# Patient Record
Sex: Female | Born: 1960 | Race: White | Hispanic: No | Marital: Married | Smoking: Former smoker
Health system: Southern US, Community
[De-identification: ages and names within clinical notes are randomized; demographics above are authoritative.]

## PROBLEM LIST (undated history)

## (undated) DIAGNOSIS — M797 Fibromyalgia: Secondary | ICD-10-CM

## (undated) DIAGNOSIS — K279 Peptic ulcer, site unspecified, unspecified as acute or chronic, without hemorrhage or perforation: Secondary | ICD-10-CM

## (undated) DIAGNOSIS — R609 Edema, unspecified: Secondary | ICD-10-CM

## (undated) DIAGNOSIS — F411 Generalized anxiety disorder: Secondary | ICD-10-CM

## (undated) DIAGNOSIS — F329 Major depressive disorder, single episode, unspecified: Secondary | ICD-10-CM

## (undated) DIAGNOSIS — I10 Essential (primary) hypertension: Secondary | ICD-10-CM

## (undated) DIAGNOSIS — E785 Hyperlipidemia, unspecified: Secondary | ICD-10-CM

## (undated) DIAGNOSIS — K7689 Other specified diseases of liver: Secondary | ICD-10-CM

## (undated) DIAGNOSIS — E039 Hypothyroidism, unspecified: Secondary | ICD-10-CM

## (undated) DIAGNOSIS — K760 Fatty (change of) liver, not elsewhere classified: Secondary | ICD-10-CM

## (undated) DIAGNOSIS — F32A Depression, unspecified: Secondary | ICD-10-CM

## (undated) HISTORY — DX: Major depressive disorder, single episode, unspecified: F32.9

## (undated) HISTORY — DX: Essential (primary) hypertension: I10

## (undated) HISTORY — DX: Other specified diseases of liver: K76.89

## (undated) HISTORY — DX: Hypothyroidism, unspecified: E03.9

## (undated) HISTORY — DX: Depression, unspecified: F32.A

## (undated) HISTORY — DX: Generalized anxiety disorder: F41.1

## (undated) HISTORY — PX: TUBAL LIGATION: SHX77

## (undated) HISTORY — DX: Edema, unspecified: R60.9

## (undated) HISTORY — DX: Fibromyalgia: M79.7

## (undated) HISTORY — DX: Peptic ulcer, site unspecified, unspecified as acute or chronic, without hemorrhage or perforation: K27.9

## (undated) HISTORY — DX: Hyperlipidemia, unspecified: E78.5

## (undated) HISTORY — DX: Fatty (change of) liver, not elsewhere classified: K76.0

---

## 1988-10-26 DIAGNOSIS — M797 Fibromyalgia: Secondary | ICD-10-CM

## 1988-10-26 HISTORY — DX: Fibromyalgia: M79.7

## 2001-10-26 HISTORY — PX: VAGINAL HYSTERECTOMY: SUR661

## 2007-08-31 ENCOUNTER — Encounter: Admission: RE | Admit: 2007-08-31 | Discharge: 2007-08-31 | Payer: Self-pay | Admitting: *Deleted

## 2007-10-27 HISTORY — PX: CHOLECYSTECTOMY: SHX55

## 2008-01-24 ENCOUNTER — Emergency Department (HOSPITAL_COMMUNITY): Admission: EM | Admit: 2008-01-24 | Discharge: 2008-01-24 | Payer: Self-pay | Admitting: Emergency Medicine

## 2008-02-04 ENCOUNTER — Emergency Department (HOSPITAL_COMMUNITY): Admission: EM | Admit: 2008-02-04 | Discharge: 2008-02-04 | Payer: Self-pay | Admitting: Emergency Medicine

## 2008-04-30 ENCOUNTER — Ambulatory Visit: Payer: Self-pay | Admitting: Internal Medicine

## 2008-04-30 DIAGNOSIS — E039 Hypothyroidism, unspecified: Secondary | ICD-10-CM | POA: Insufficient documentation

## 2008-04-30 DIAGNOSIS — R609 Edema, unspecified: Secondary | ICD-10-CM | POA: Insufficient documentation

## 2008-04-30 DIAGNOSIS — I10 Essential (primary) hypertension: Secondary | ICD-10-CM | POA: Insufficient documentation

## 2008-04-30 DIAGNOSIS — E785 Hyperlipidemia, unspecified: Secondary | ICD-10-CM

## 2008-04-30 LAB — CONVERTED CEMR LAB: Blood Glucose, Fingerstick: 107

## 2008-05-29 ENCOUNTER — Telehealth: Payer: Self-pay | Admitting: Internal Medicine

## 2008-08-01 ENCOUNTER — Telehealth: Payer: Self-pay | Admitting: Internal Medicine

## 2008-12-20 ENCOUNTER — Ambulatory Visit: Payer: Self-pay | Admitting: Internal Medicine

## 2009-03-23 IMAGING — CR DG CHEST 2V
2 series · 2 of 2 positions shown · non-contrast
Comparison: None

CLINICAL DATA: Short of breath and epigastric abdominal pain

CHEST - 2 VIEW

[w chest pa]
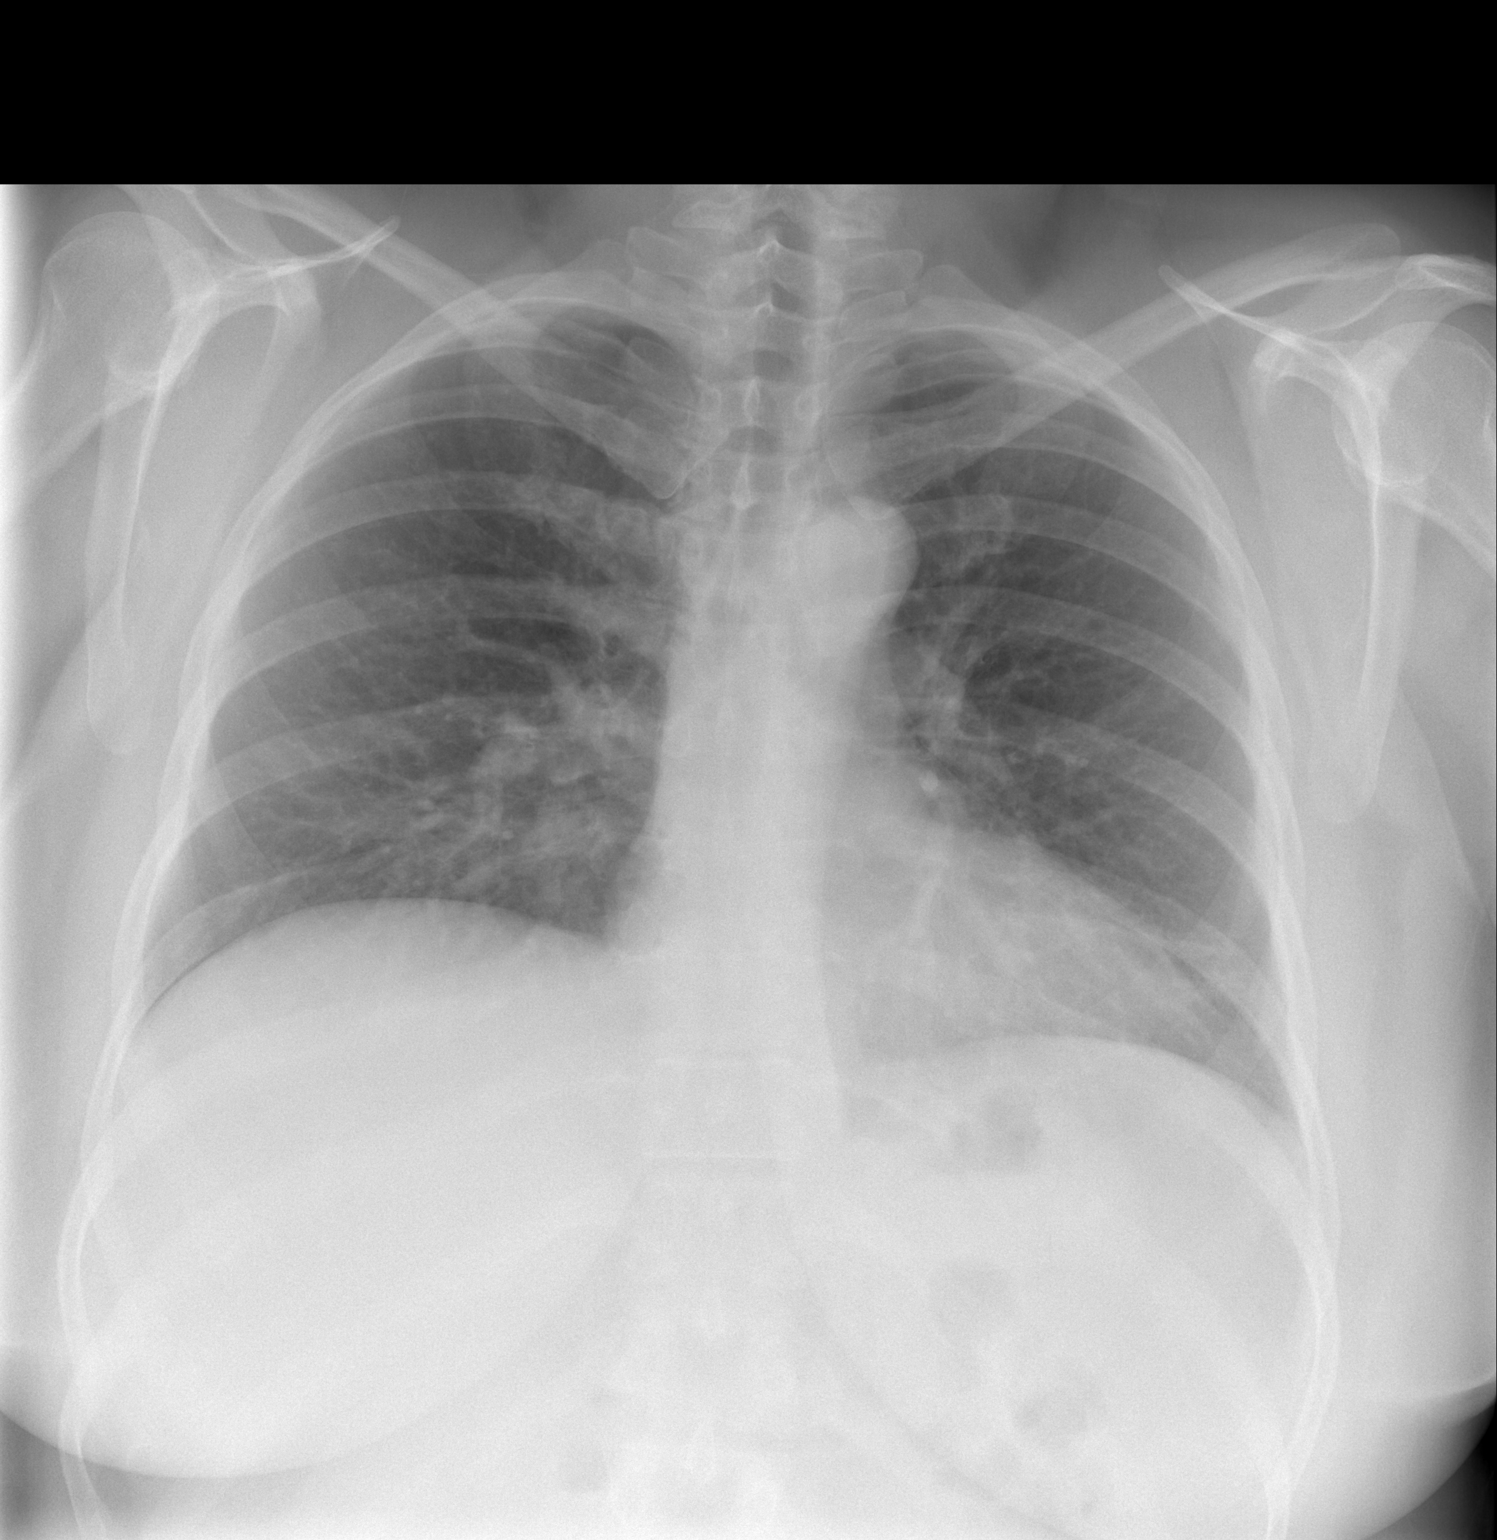

[w chest lat]
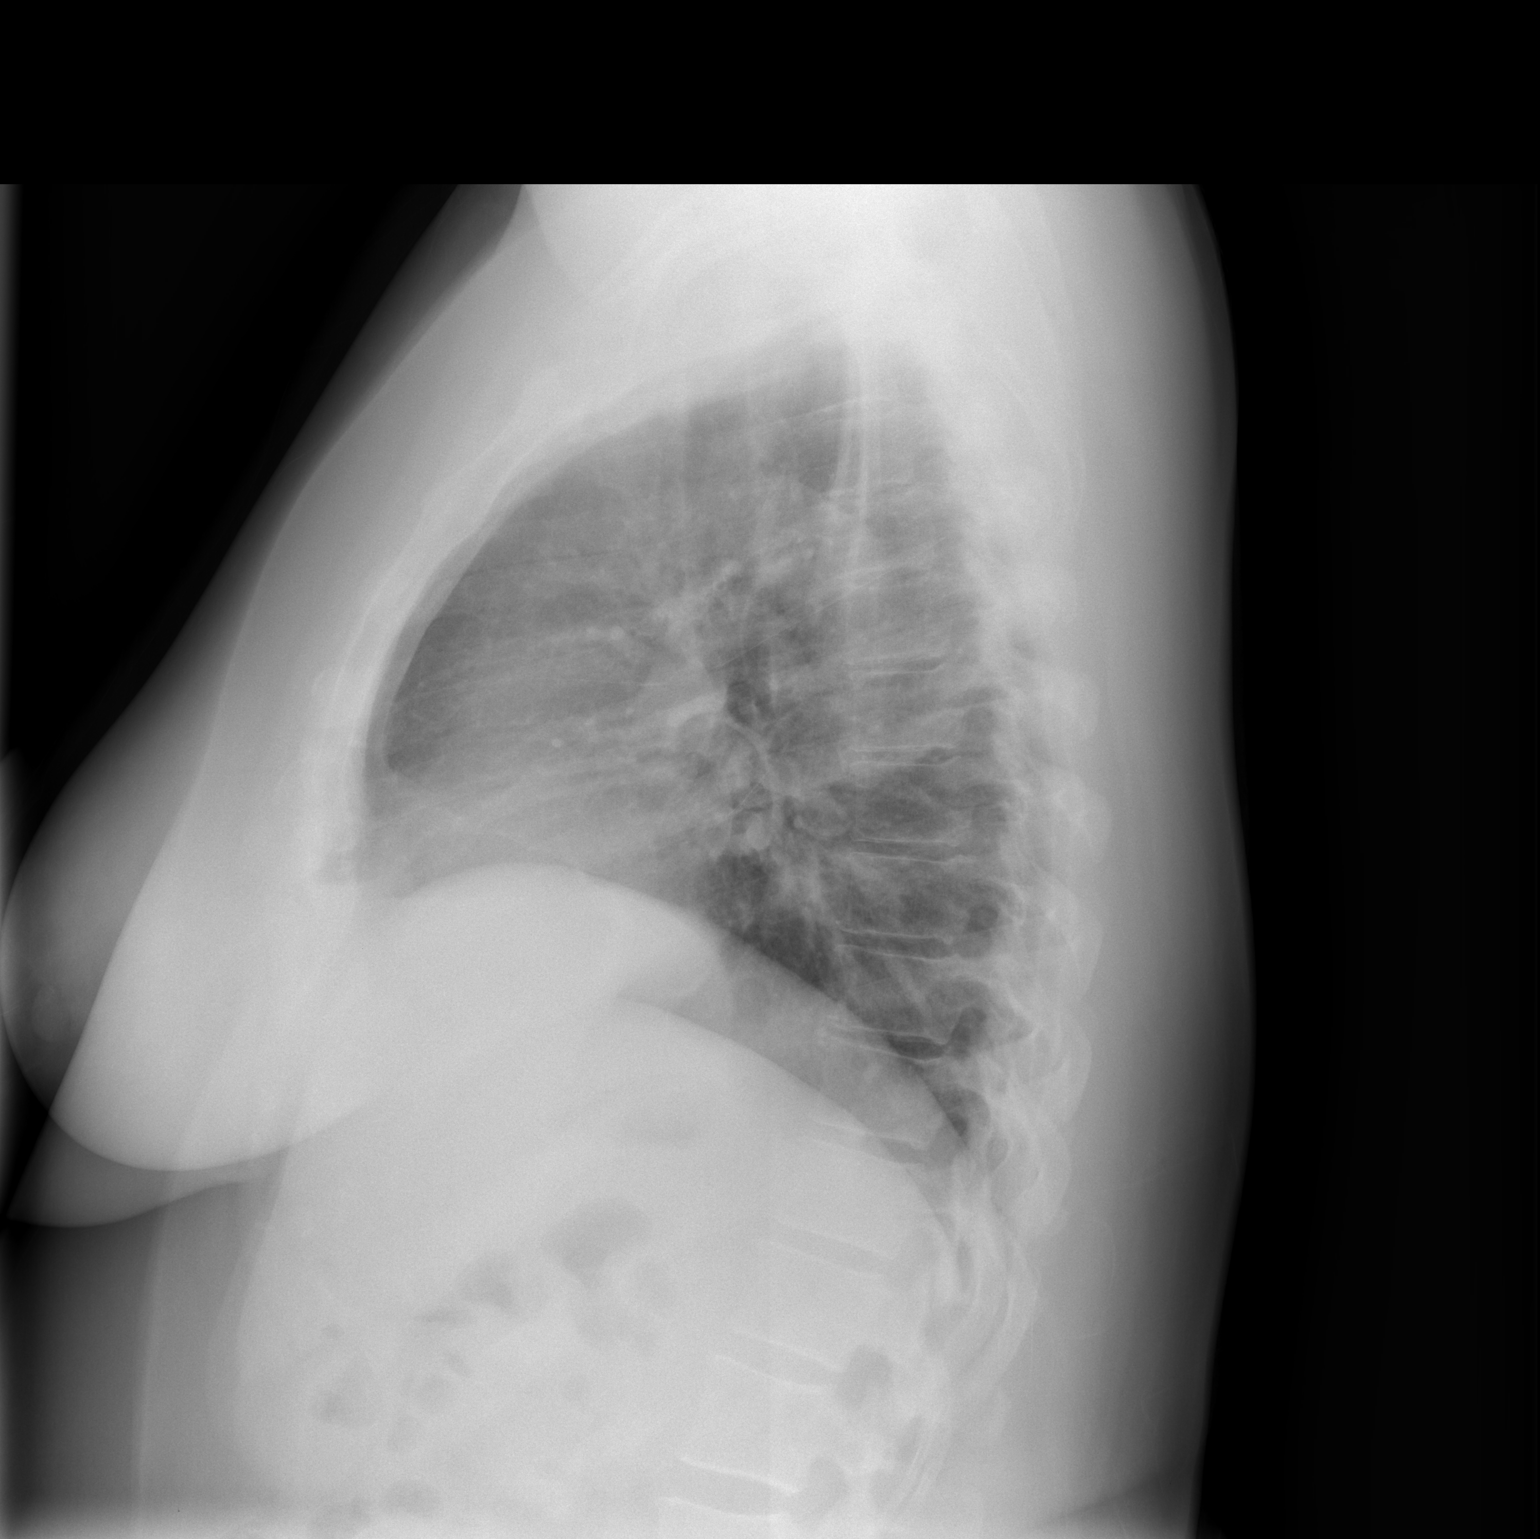

[2 of 2 positions shown; findings below may reference images not displayed]

FINDINGS: Low bilateral lung volumes are present with bibasilar
atelectasis.  No edema or infiltrate.  The heart size is normal.
No pleural effusions.  Bony thorax is unremarkable.
IMPRESSION: No active disease.  Low volumes with bibasilar atelectasis.

## 2009-03-23 IMAGING — US US ABDOMEN COMPLETE
1 series · 13 of 25 positions shown · non-contrast
Comparison: None.

CLINICAL DATA: Right upper quadrant abdominal pain.

ABDOMEN ULTRASOUND
TECHNIQUE: Complete abdominal ultrasound examination was performed
including evaluation of the liver, gallbladder, bile ducts,
pancreas, kidneys, spleen, IVC, and abdominal aorta.

[Series 1: unknown · 0.37mm/px · 13 of 85 slices shown]
[im 1/85]
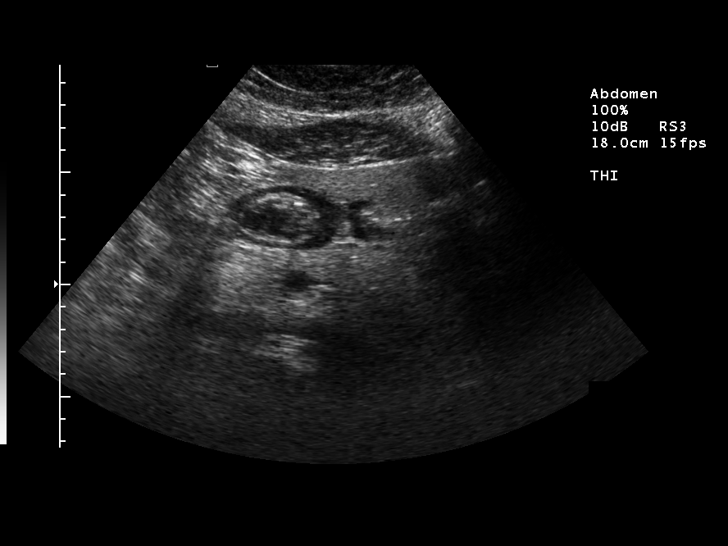
[im 8/85]
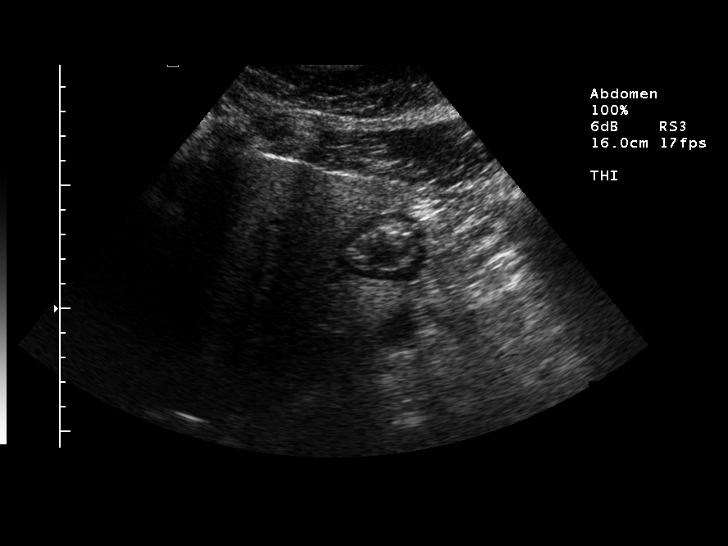
[im 15/85]
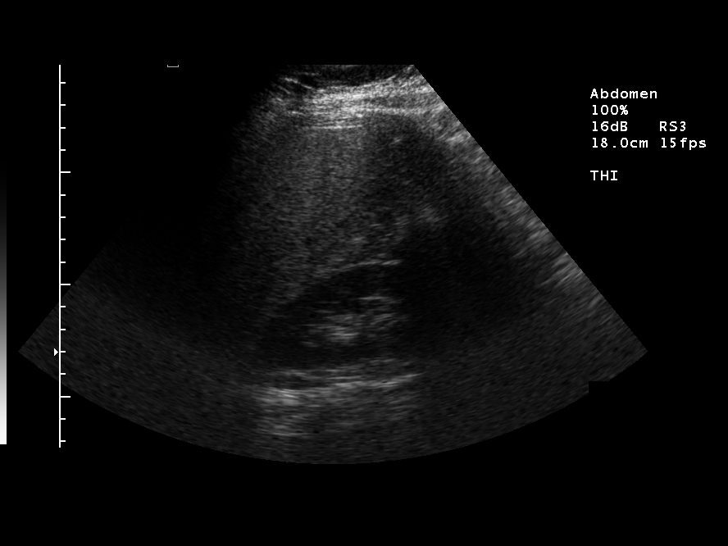
[im 22/85]
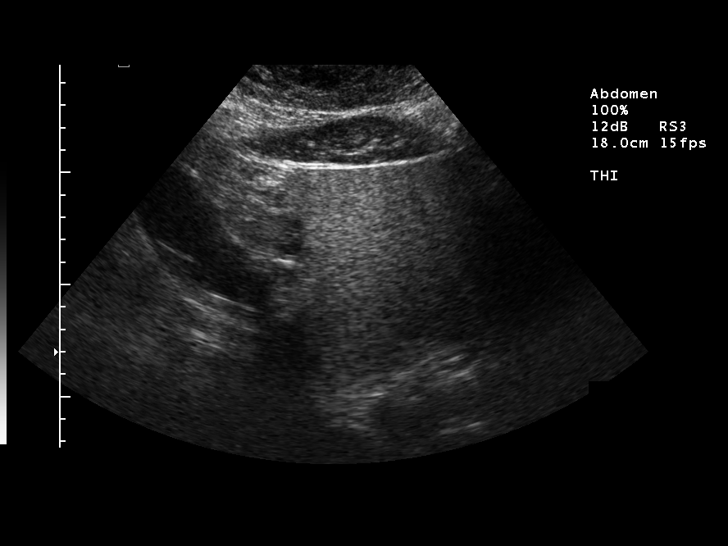
[im 29/85]
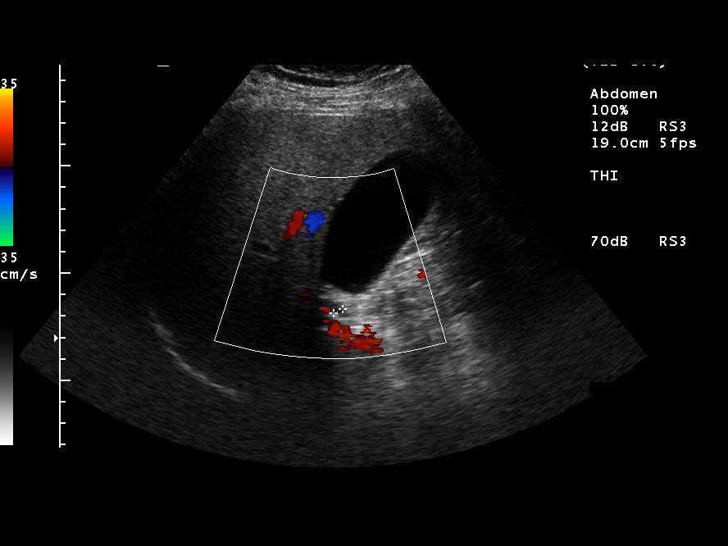
[im 36/85]
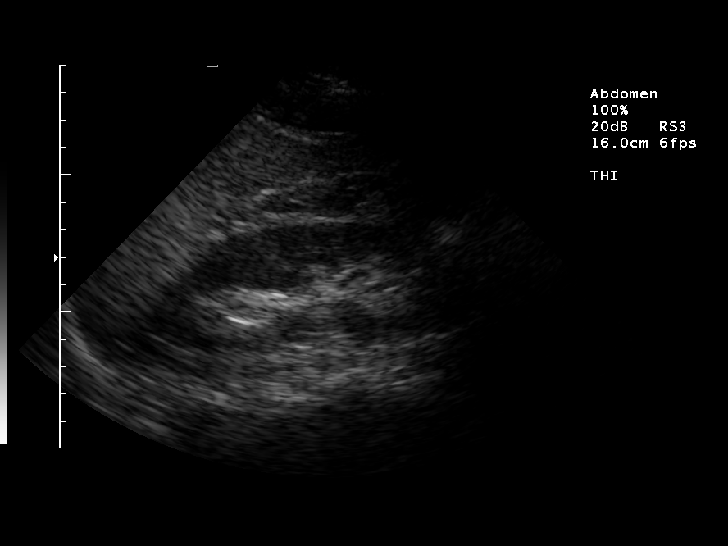
[im 43/85]
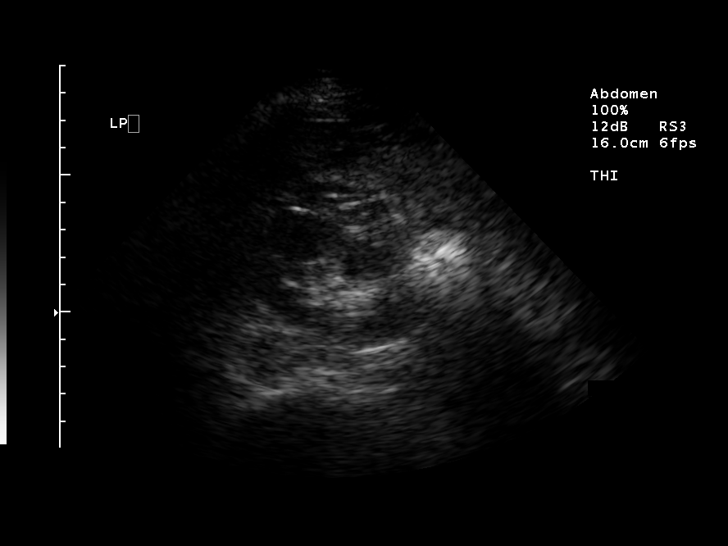
[im 50/85]
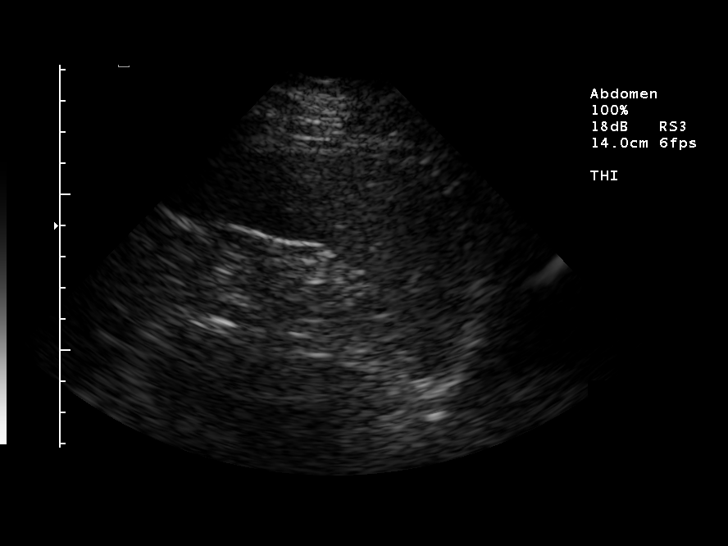
[im 57/85]
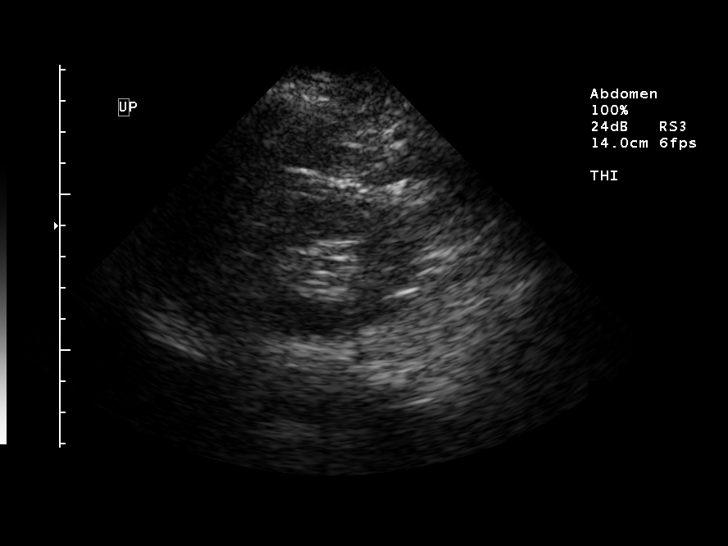
[im 64/85]
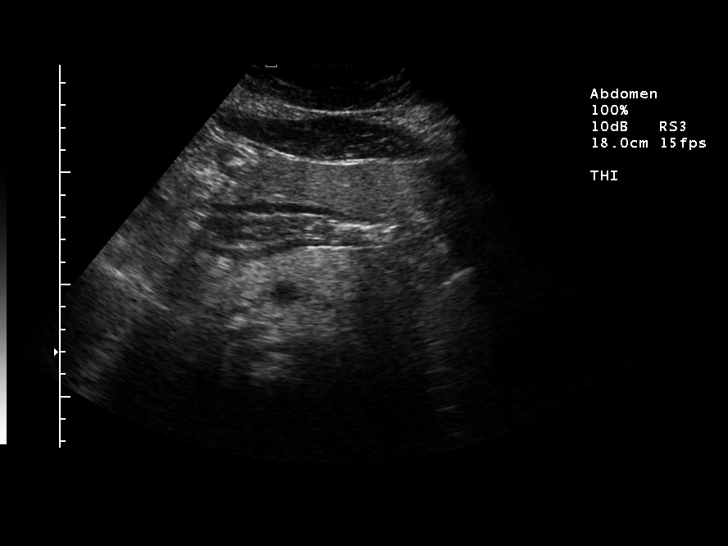
[im 71/85]
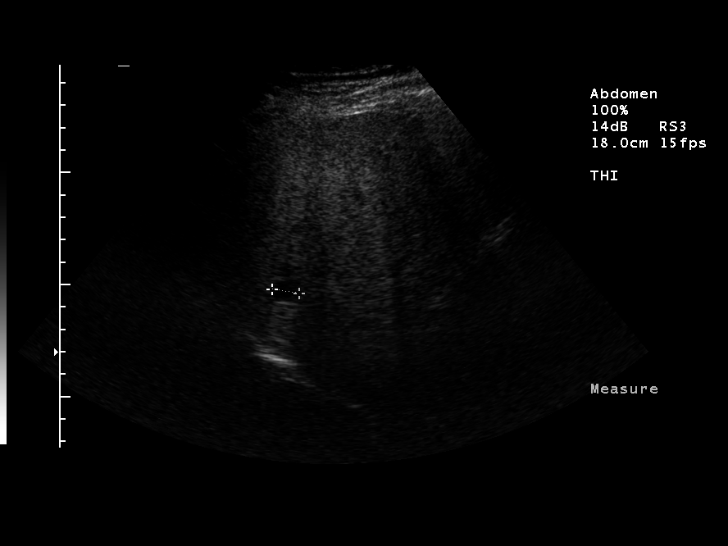
[im 78/85]
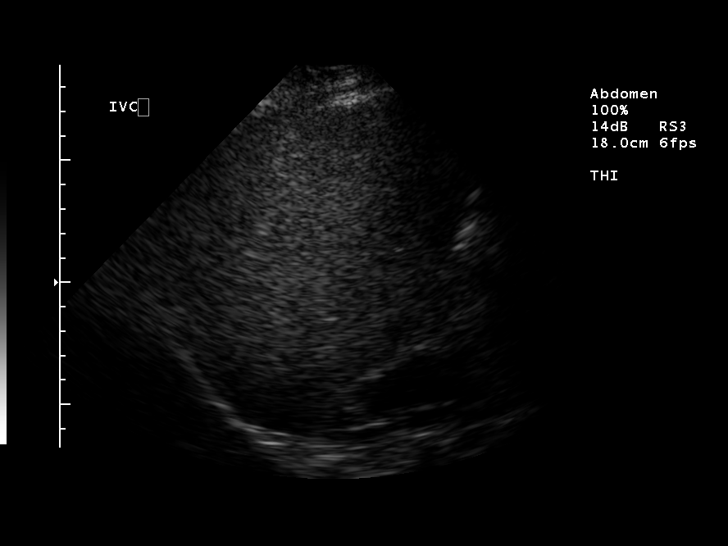
[im 85/85]
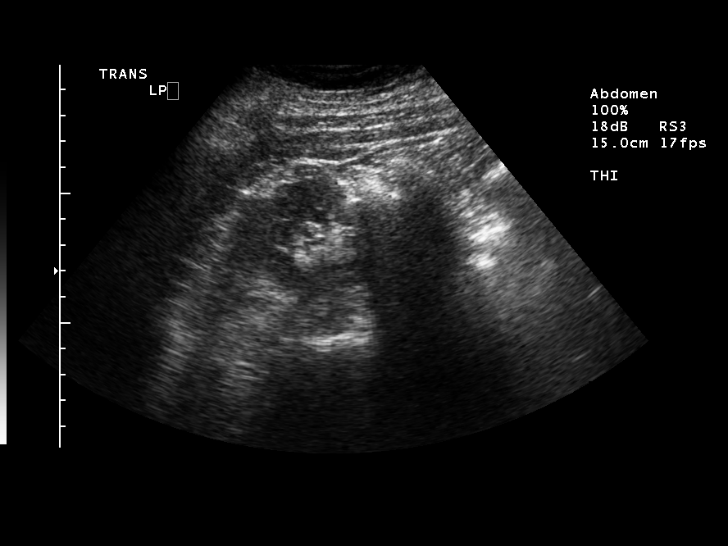

[13 of 25 positions shown; findings below may reference images not displayed]

FINDINGS: Multiple gallstones are seen within the neck of the
gallbladder.  The gallbladder wall is of normal thickness,
measuring 2.4 mm maximally.  There is no sonographic Murphy's sign.

Mild fatty infiltration of liver is evident.  Multiple benign-
appearing cysts are present within the right lobe.  The largest
measures 1.7 x 1.3 x 1.5 cm.  Common bile duct is within normal
limits at 4.6 mm.

The IVC is patent.  The visualized portions of pancreas, including
the head and proximal body are within normal limits.  There is some
limitation due to overlying bowel gas.  The spleen is of normal
size and echotexture, measuring 8.1 cm maximally.  The aorta
measures 1.8 cm maximally.  It is not well seen due to overlying
bowel gas.

The kidneys are of normal size and echotexture bilaterally.  No
focal stones or mass lesions are seen.  There is no hydronephrosis.
The right kidney measures 14.2 cm maximally.  The left kidney
measures 13.0 cm maximally.
IMPRESSION: 1.  Cholelithiasis without sonographic evidence for cholecystitis.
2.  Benign-appearing liver cysts.
3.  Probable fatty infiltration of the liver.

## 2010-01-16 ENCOUNTER — Ambulatory Visit: Payer: Self-pay | Admitting: Internal Medicine

## 2010-01-16 DIAGNOSIS — F411 Generalized anxiety disorder: Secondary | ICD-10-CM | POA: Insufficient documentation

## 2010-05-19 ENCOUNTER — Telehealth: Payer: Self-pay | Admitting: Internal Medicine

## 2010-11-16 ENCOUNTER — Encounter: Payer: Self-pay | Admitting: Internal Medicine

## 2010-11-16 ENCOUNTER — Encounter: Payer: Self-pay | Admitting: Emergency Medicine

## 2010-11-25 NOTE — Assessment & Plan Note (Signed)
Summary: renew meds//ccm/pt rsc/cjr   Vital Signs:  Patient profile:   50 year old female Weight:      210 pounds Temp:     98.5 degrees F oral BP sitting:   122 / 70  (left arm) Cuff size:   regular  Vitals Entered By: Duard Brady LPN (January 16, 2010 2:03 PM) CC: medication review with refills for all , requestion wellbutrin - mother just dx with termianl Ca Is Patient Diabetic? No   CC:  medication review with refills for all  and requestion wellbutrin - mother just dx with termianl Ca.  History of Present Illness: 50 year old patient who is seen today for medical follow-up.  She has not been seen in over one year.  Her mother has resummoned diagnosed with terminal cancer and she is doing quite anxious and distraught.  She has been tearful and has been having sleeping difficulties.  She has treated hypertension, which has been stable.  She has hypothyroidism and also a history of allergic rhinitis.  There is a history of mild dyslipidemia.  Preventive Screening-Counseling & Management  Alcohol-Tobacco     Smoking Status: never  Allergies: 1)  ! Erythromycin (Erythromycin)  Past History:  Past Medical History: Hyperlipidemia Hypertension lower extremity edema Hypothyroidism remote peptic ulcer disease situational anxiety Anxiety  Past Surgical History: Reviewed history from 04/30/2008 and no changes required. status post cholecystectomy 2009 hysterectomy.  2000 tubal ligation 1989  Family History: father age 101, status post CABG at age 61 mother, age 32.  History breast cancer, hypertension, dyslipidemia, senile dementia, hypothyroidism-h/o ovarian Ca   Two brothers living and well -two sistershistory of hypothyroidism  Maternal grandmother also with breast cancer  Social History: Smoking Status:  never  Review of Systems       The patient complains of weight gain and depression.  The patient denies anorexia, fever, weight loss, vision loss,  decreased hearing, hoarseness, chest pain, syncope, dyspnea on exertion, peripheral edema, prolonged cough, headaches, hemoptysis, abdominal pain, melena, hematochezia, severe indigestion/heartburn, hematuria, incontinence, genital sores, muscle weakness, suspicious skin lesions, transient blindness, difficulty walking, unusual weight change, abnormal bleeding, enlarged lymph nodes, angioedema, and breast masses.    Physical Exam  General:  overweight-appearing.  120/72overweight-appearing.   Head:  Normocephalic and atraumatic without obvious abnormalities. No apparent alopecia or balding. Eyes:  No corneal or conjunctival inflammation noted. EOMI. Perrla. Funduscopic exam benign, without hemorrhages, exudates or papilledema. Vision grossly normal. Mouth:  Oral mucosa and oropharynx without lesions or exudates.  Teeth in good repair. Neck:  No deformities, masses, or tenderness noted. Lungs:  Normal respiratory effort, chest expands symmetrically. Lungs are clear to auscultation, no crackles or wheezes. Heart:  Normal rate and regular rhythm. S1 and S2 normal without gallop, murmur, click, rub or other extra sounds. Abdomen:  Bowel sounds positive,abdomen soft and non-tender without masses, organomegaly or hernias noted. Msk:  No deformity or scoliosis noted of thoracic or lumbar spine.   Pulses:  R and L carotid,radial,femoral,dorsalis pedis and posterior tibial pulses are full and equal bilaterally Extremities:  No clubbing, cyanosis, edema, or deformity noted with normal full range of motion of all joints.     Impression & Recommendations:  Problem # 1:  ANXIETY (ICD-300.00)  Her updated medication list for this problem includes:    Bupropion Hcl 150 Mg Xr12h-tab (Bupropion hcl) ..... One twice daily    Lorazepam 0.5 Mg Tabs (Lorazepam) ..... One twice daily as needed  Her updated medication list for  this problem includes:    Bupropion Hcl 150 Mg Xr12h-tab (Bupropion hcl) ..... One  twice daily    Lorazepam 0.5 Mg Tabs (Lorazepam) ..... One twice daily as needed  Problem # 2:  HYPOTHYROIDISM (ICD-244.9)  Her updated medication list for this problem includes:    Synthroid 25 Mcg Tabs (Levothyroxine sodium) .Marland Kitchen... 1 once daily  Her updated medication list for this problem includes:    Synthroid 25 Mcg Tabs (Levothyroxine sodium) .Marland Kitchen... 1 once daily  Problem # 3:  HYPERTENSION (ICD-401.9)  Her updated medication list for this problem includes:    Lisinopril 20 Mg Tabs (Lisinopril) .Marland Kitchen... 1 once daily    Lasix 40 Mg Tabs (Furosemide) .Marland Kitchen... 1 once daily    Triamterene-hctz 75-50 Mg Tabs (Triamterene-hctz) .Marland Kitchen... 1 once daily as needed  Her updated medication list for this problem includes:    Lisinopril 20 Mg Tabs (Lisinopril) .Marland Kitchen... 1 once daily    Lasix 40 Mg Tabs (Furosemide) .Marland Kitchen... 1 once daily    Triamterene-hctz 75-50 Mg Tabs (Triamterene-hctz) .Marland Kitchen... 1 once daily as needed  Problem # 4:  HYPERLIPIDEMIA (ICD-272.4)  Complete Medication List: 1)  Synthroid 25 Mcg Tabs (Levothyroxine sodium) .Marland Kitchen.. 1 once daily 2)  Lisinopril 20 Mg Tabs (Lisinopril) .Marland Kitchen.. 1 once daily 3)  Prilosec 20 Mg Cpdr (Omeprazole) .Marland Kitchen.. 1 once daily 4)  Lasix 40 Mg Tabs (Furosemide) .Marland Kitchen.. 1 once daily 5)  Triamterene-hctz 75-50 Mg Tabs (Triamterene-hctz) .Marland Kitchen.. 1 once daily as needed 6)  Fexofenadine Hcl 180 Mg Tabs (Fexofenadine hcl) .... One daily 7)  Bupropion Hcl 150 Mg Xr12h-tab (Bupropion hcl) .... One twice daily 8)  Lorazepam 0.5 Mg Tabs (Lorazepam) .... One twice daily as needed  Patient Instructions: 1)  Please schedule a follow-up appointment in 3 months for annual exam 2)  Limit your Sodium (Salt) to less than 2 grams a day(slightly less than 1/2 a teaspoon) to prevent fluid retention, swelling, or worsening of symptoms. 3)  It is important that you exercise regularly at least 20 minutes 5 times a week. If you develop chest pain, have severe difficulty breathing, or feel very tired ,  stop exercising immediately and seek medical attention. 4)  You need to lose weight. Consider a lower calorie diet and regular exercise.  5)  Check your Blood Pressure regularly. If it is above: 150/90 you should make an appointment. Prescriptions: LORAZEPAM 0.5 MG TABS (LORAZEPAM) one twice daily as needed  #50 x 2   Entered and Authorized by:   Gordy Savers  MD   Signed by:   Gordy Savers  MD on 01/16/2010   Method used:   Print then Give to Patient   RxID:   [id number] BUPROPION HCL 150 MG XR12H-TAB (BUPROPION HCL) one twice daily  #180 x 4   Entered and Authorized by:   Gordy Savers  MD   Signed by:   Gordy Savers  MD on 01/16/2010   Method used:   Print then Give to Patient   RxID:   [id number] FEXOFENADINE HCL 180 MG TABS (FEXOFENADINE HCL) one daily  #90 x 6   Entered and Authorized by:   Gordy Savers  MD   Signed by:   Gordy Savers  MD on 01/16/2010   Method used:   Print then Give to Patient   RxID:   [id number] TRIAMTERENE-HCTZ 75-50 MG  TABS (TRIAMTERENE-HCTZ) 1 once daily as needed  #90 x 4   Entered and Authorized by:  Gordy Savers  MD   Signed by:   Gordy Savers  MD on 01/16/2010   Method used:   Print then Give to Patient   RxID:   [id number] LASIX 40 MG  TABS (FUROSEMIDE) 1 once daily  #90 x 4   Entered and Authorized by:   Gordy Savers  MD   Signed by:   Gordy Savers  MD on 01/16/2010   Method used:   Print then Give to Patient   RxID:   [id number] PRILOSEC 20 MG  CPDR (OMEPRAZOLE) 1 once daily  #90 x 4   Entered and Authorized by:   Gordy Savers  MD   Signed by:   Gordy Savers  MD on 01/16/2010   Method used:   Print then Give to Patient   RxID:   [id number] LISINOPRIL 20 MG  TABS (LISINOPRIL) 1 once daily  #90 x 4   Entered and Authorized by:   Gordy Savers  MD   Signed by:   Gordy Savers  MD on 01/16/2010   Method used:   Print  then Give to Patient   RxID:   [id number] SYNTHROID 25 MCG  TABS (LEVOTHYROXINE SODIUM) 1 once daily  #90 x 4   Entered and Authorized by:   Gordy Savers  MD   Signed by:   Gordy Savers  MD on 01/16/2010   Method used:   Print then Give to Patient   RxID:   [id number]

## 2010-11-25 NOTE — Progress Notes (Signed)
Summary: ativan refill  Phone Note Call from Patient Call back at [phone]   Summary of Call: patient is calling for a refill of ativan.  [facility] [address] Initial call taken by: Kern Reap CMA Duncan Dull),  May 19, 2010 3:48 PM  Follow-up for Phone Call        OK to RF if needed Follow-up by: Gordy Savers  MD,  May 19, 2010 3:56 PM  Additional Follow-up for Phone Call Additional follow up Details #1::        Phone Call Completed, Rx Called In Additional Follow-up by: Kern Reap CMA Duncan Dull),  May 19, 2010 4:55 PM

## 2011-01-24 ENCOUNTER — Other Ambulatory Visit: Payer: Self-pay | Admitting: Internal Medicine

## 2011-01-28 ENCOUNTER — Encounter: Payer: Self-pay | Admitting: Internal Medicine

## 2011-02-02 ENCOUNTER — Encounter: Payer: Self-pay | Admitting: Internal Medicine

## 2011-02-02 ENCOUNTER — Ambulatory Visit (INDEPENDENT_AMBULATORY_CARE_PROVIDER_SITE_OTHER): Payer: BC Managed Care – PPO | Admitting: Internal Medicine

## 2011-02-02 DIAGNOSIS — E039 Hypothyroidism, unspecified: Secondary | ICD-10-CM

## 2011-02-02 DIAGNOSIS — I10 Essential (primary) hypertension: Secondary | ICD-10-CM

## 2011-02-02 DIAGNOSIS — E785 Hyperlipidemia, unspecified: Secondary | ICD-10-CM

## 2011-02-02 DIAGNOSIS — R609 Edema, unspecified: Secondary | ICD-10-CM

## 2011-02-02 MED ORDER — LISINOPRIL 20 MG PO TABS: 20.0000 mg | ORAL_TABLET | Freq: Every day | ORAL | Status: DC

## 2011-02-02 MED ORDER — BUPROPION HCL ER (XL) 150 MG PO TB24: 150.0000 mg | ORAL_TABLET | Freq: Two times a day (BID) | ORAL | Status: DC

## 2011-02-02 MED ORDER — TRIAMTERENE-HCTZ 75-50 MG PO TABS: 1.0000 | ORAL_TABLET | Freq: Every day | ORAL | Status: DC

## 2011-02-02 MED ORDER — OMEPRAZOLE 20 MG PO CPDR: 20.0000 mg | DELAYED_RELEASE_CAPSULE | Freq: Every day | ORAL | Status: DC

## 2011-02-02 MED ORDER — FUROSEMIDE 40 MG PO TABS: 40.0000 mg | ORAL_TABLET | Freq: Every day | ORAL | Status: DC

## 2011-02-02 MED ORDER — LEVOTHYROXINE SODIUM 25 MCG PO TABS: 25.0000 ug | ORAL_TABLET | Freq: Every day | ORAL | Status: DC

## 2011-02-02 MED ORDER — FEXOFENADINE HCL 180 MG PO TABS: 180.0000 mg | ORAL_TABLET | Freq: Every day | ORAL | Status: DC

## 2011-02-02 MED ORDER — LORAZEPAM 0.5 MG PO TABS: 0.5000 mg | ORAL_TABLET | Freq: Two times a day (BID) | ORAL | Status: DC | PRN

## 2011-02-02 NOTE — Patient Instructions (Signed)
Limit your sodium (Salt) intake    It is important that you exercise regularly, at least 20 minutes 3 to 4 times per week.  If you develop chest pain or shortness of breath seek  medical attention.  You need to lose weight.  Consider a lower calorie diet and regular exercise.  Please check your blood pressure on a regular basis.  If it is consistently greater than 150/90, please make an office appointment.  Return in 6 months for follow-up  Take a calcium supplement, plus 800-1200 units of vitamin D 

## 2011-02-02 NOTE — Progress Notes (Signed)
  Subjective:    Patient ID: Marcia Branch, female    DOB: 1960/12/20, 50 y.o.   MRN: [id number]  HPI  50 year old patient who is seen today for followup. Complaints include some vasomotor symptoms. She did have a remote hysterectomy at age 71. She has treated hypertension which has done quite well on her present therapy she is difficulties with pedal edema additional problems include hypothyroidism and a history of depression. Bladder has been stable on Wellbutrin. No concerns or complaints today except for some worsening bilateral carpal tunnel symptoms. This has been confirmed on testing. No recent lab   Review of Systems  Constitutional: Negative.   HENT: Negative for hearing loss, congestion, sore throat, rhinorrhea, dental problem, sinus pressure and tinnitus.   Eyes: Negative for pain, discharge and visual disturbance.  Respiratory: Negative for cough and shortness of breath.   Cardiovascular: Negative for chest pain, palpitations and leg swelling.  Gastrointestinal: Negative for nausea, vomiting, abdominal pain, diarrhea, constipation, blood in stool and abdominal distention.  Genitourinary: Negative for dysuria, urgency, frequency, hematuria, flank pain, vaginal bleeding, vaginal discharge, difficulty urinating, vaginal pain and pelvic pain.  Musculoskeletal: Negative for joint swelling, arthralgias and gait problem.  Skin: Negative for rash.  Neurological: Positive for numbness. Negative for dizziness, syncope, speech difficulty, weakness and headaches.       [Bilateral hand numbness especially in the morning Hematological: Negative for adenopathy.  Psychiatric/Behavioral: Negative for behavioral problems, dysphoric mood and agitation. The patient is not nervous/anxious.        Objective:   Physical Exam  Constitutional: She is oriented to person, place, and time. She appears well-developed and well-nourished.       Obese  HENT:  Head: Normocephalic.  Right Ear: External ear  normal.  Left Ear: External ear normal.  Mouth/Throat: Oropharynx is clear and moist.  Eyes: Conjunctivae and EOM are normal. Pupils are equal, round, and reactive to light.  Neck: Normal range of motion. Neck supple. No thyromegaly present.  Cardiovascular: Normal rate, regular rhythm, normal heart sounds and intact distal pulses.   Pulmonary/Chest: Effort normal and breath sounds normal.  Abdominal: Soft. Bowel sounds are normal. She exhibits no mass. There is no tenderness.  Musculoskeletal: Normal range of motion. She exhibits edema.       Trace edema  Lymphadenopathy:    She has no cervical adenopathy.  Neurological: She is alert and oriented to person, place, and time.  Skin: Skin is warm and dry. No rash noted.  Psychiatric: She has a normal mood and affect. Her behavior is normal.          Assessment & Plan:  Hypertension stable Hypothyroidism. No recent lab Menopausal syndrome. Exogenous obesity Symptomatic carpal tunnel syndrome History depression stable  We'll schedule complete exam with lab Nocturnal wrist splints were discussed Weight loss and salt restricted diet encouraged

## 2011-02-03 LAB — BASIC METABOLIC PANEL
Glucose, Bld: 97 mg/dL (ref 70–99)
Sodium: 138 mEq/L (ref 135–145)

## 2011-02-03 LAB — CBC WITH DIFFERENTIAL/PLATELET
Basophils Absolute: 0 10*3/uL (ref 0.0–0.1)
Basophils Relative: 0.5 % (ref 0.0–3.0)
Eosinophils Relative: 1.4 % (ref 0.0–5.0)
HCT: 40 % (ref 36.0–46.0)
MCHC: 34 g/dL (ref 30.0–36.0)
Monocytes Relative: 5.9 % (ref 3.0–12.0)
Platelets: 240 10*3/uL (ref 150.0–400.0)
RDW: 13.1 % (ref 11.5–14.6)

## 2011-02-03 LAB — CHOLESTEROL, TOTAL: Cholesterol: 213 mg/dL — ABNORMAL HIGH (ref 0–200)

## 2011-02-03 LAB — HEPATIC FUNCTION PANEL
Albumin: 4.3 g/dL (ref 3.5–5.2)
Total Bilirubin: 0.7 mg/dL (ref 0.3–1.2)

## 2011-02-04 ENCOUNTER — Telehealth: Payer: Self-pay | Admitting: *Deleted

## 2011-02-04 NOTE — Telephone Encounter (Signed)
Would like lab results, please.  

## 2011-02-05 NOTE — Telephone Encounter (Signed)
Has lab for cpx been drawn yet?   What lab??

## 2011-02-05 NOTE — Telephone Encounter (Signed)
Labs drawn 02/02/2011

## 2011-02-05 NOTE — Telephone Encounter (Signed)
Notified pt. 

## 2011-02-05 NOTE — Telephone Encounter (Signed)
All ok; cholesterol 213  Mildly elevated

## 2011-06-20 ENCOUNTER — Other Ambulatory Visit: Payer: Self-pay | Admitting: Internal Medicine

## 2011-06-25 ENCOUNTER — Ambulatory Visit: Payer: BC Managed Care – PPO | Admitting: Internal Medicine

## 2011-07-20 LAB — BASIC METABOLIC PANEL
BUN: 9
CO2: 29
Creatinine, Ser: 0.68
GFR calc non Af Amer: >60
Glucose, Bld: 107 — ABNORMAL HIGH
Potassium: 3.6

## 2011-07-20 LAB — CBC
HCT: 40.9
Hemoglobin: 14.1
MCHC: 34.4
MCV: 90.8
Platelets: 258
RBC: 4.51
RDW: 12.9
WBC: 7.6

## 2011-07-20 LAB — URINALYSIS, ROUTINE W REFLEX MICROSCOPIC
Nitrite: NEGATIVE
Specific Gravity, Urine: 1.005

## 2011-07-20 LAB — DIFFERENTIAL
Eosinophils Absolute: 0.1
Eosinophils Relative: 1
Lymphocytes Relative: 24
Monocytes Absolute: 0.5
Neutro Abs: 5.1
Neutrophils Relative %: 67

## 2011-07-21 LAB — URINALYSIS, ROUTINE W REFLEX MICROSCOPIC
Glucose, UA: NEGATIVE
Hgb urine dipstick: NEGATIVE
Ketones, ur: NEGATIVE
Nitrite: NEGATIVE
Protein, ur: NEGATIVE
Urobilinogen, UA: 0.2
pH: 6

## 2011-07-21 LAB — COMPREHENSIVE METABOLIC PANEL
Albumin: 4.2
BUN: 10
Creatinine, Ser: 0.7
Total Bilirubin: 1
Total Protein: 7

## 2011-07-21 LAB — DIFFERENTIAL
Basophils Absolute: 0.1
Lymphocytes Relative: 21
Monocytes Absolute: 0.5
Neutro Abs: 5.8

## 2011-07-21 LAB — CK TOTAL AND CKMB (NOT AT ARMC)
Relative Index: INVALID
Total CK: 60

## 2011-07-21 LAB — D-DIMER, QUANTITATIVE: D-Dimer, Quant: 0.24

## 2011-07-21 LAB — CBC
HCT: 42.1
MCHC: 35
MCV: 90.8
Platelets: 264
RDW: 12.7

## 2011-09-06 ENCOUNTER — Other Ambulatory Visit: Payer: Self-pay | Admitting: Internal Medicine

## 2011-09-16 ENCOUNTER — Other Ambulatory Visit: Payer: Self-pay | Admitting: Internal Medicine

## 2011-09-16 DIAGNOSIS — Z1231 Encounter for screening mammogram for malignant neoplasm of breast: Secondary | ICD-10-CM

## 2011-09-25 ENCOUNTER — Ambulatory Visit
Admission: RE | Admit: 2011-09-25 | Discharge: 2011-09-25 | Disposition: A | Discharge status: Home / Self Care | Payer: BC Managed Care – PPO | Source: Ambulatory Visit | Attending: Internal Medicine | Admitting: Internal Medicine

## 2011-09-25 DIAGNOSIS — Z1231 Encounter for screening mammogram for malignant neoplasm of breast: Secondary | ICD-10-CM

## 2011-10-16 ENCOUNTER — Encounter: Payer: Self-pay | Admitting: Internal Medicine

## 2011-10-16 ENCOUNTER — Ambulatory Visit (INDEPENDENT_AMBULATORY_CARE_PROVIDER_SITE_OTHER): Payer: BC Managed Care – PPO | Admitting: Internal Medicine

## 2011-10-16 DIAGNOSIS — F411 Generalized anxiety disorder: Secondary | ICD-10-CM

## 2011-10-16 DIAGNOSIS — Z0000 Encounter for general adult medical examination without abnormal findings: Secondary | ICD-10-CM

## 2011-10-16 DIAGNOSIS — E039 Hypothyroidism, unspecified: Secondary | ICD-10-CM

## 2011-10-16 DIAGNOSIS — E785 Hyperlipidemia, unspecified: Secondary | ICD-10-CM

## 2011-10-16 DIAGNOSIS — Z00129 Encounter for routine child health examination without abnormal findings: Secondary | ICD-10-CM

## 2011-10-16 DIAGNOSIS — I10 Essential (primary) hypertension: Secondary | ICD-10-CM

## 2011-10-16 LAB — TSH: TSH: 0.81 u[IU]/mL (ref 0.35–5.50)

## 2011-10-16 MED ORDER — OMEPRAZOLE 20 MG PO CPDR: 20.0000 mg | DELAYED_RELEASE_CAPSULE | Freq: Every day | ORAL | Status: DC

## 2011-10-16 MED ORDER — LORAZEPAM 0.5 MG PO TABS: 0.5000 mg | ORAL_TABLET | Freq: Three times a day (TID) | ORAL | Status: DC

## 2011-10-16 MED ORDER — BUPROPION HCL ER (SR) 150 MG PO TB12: 150.0000 mg | ORAL_TABLET | Freq: Two times a day (BID) | ORAL | Status: DC

## 2011-10-16 MED ORDER — TRIAMTERENE-HCTZ 75-50 MG PO TABS: 1.0000 | ORAL_TABLET | Freq: Every day | ORAL | Status: DC

## 2011-10-16 MED ORDER — LISINOPRIL 20 MG PO TABS: 20.0000 mg | ORAL_TABLET | Freq: Every day | ORAL | Status: DC

## 2011-10-16 MED ORDER — FUROSEMIDE 40 MG PO TABS: 40.0000 mg | ORAL_TABLET | Freq: Every day | ORAL | Status: DC

## 2011-10-16 MED ORDER — LEVOTHYROXINE SODIUM 25 MCG PO TABS: 25.0000 ug | ORAL_TABLET | Freq: Every day | ORAL | Status: DC

## 2011-10-16 NOTE — Patient Instructions (Signed)
It is important that you exercise regularly, at least 20 minutes 3 to 4 times per week.  If you develop chest pain or shortness of breath seek  medical attention.  Limit your sodium (Salt) intake  Please check your blood pressure on a regular basis.  If it is consistently greater than 150/90, please make an office appointment.  Return in 4 months for follow-up  Schedule your colonoscopy to help detect colon cancer.

## 2011-10-16 NOTE — Progress Notes (Signed)
  Subjective:    Patient ID: Marcia Branch, female    DOB: 30-Mar-1961, 50 y.o.   MRN: [id number]  HPI  50 year old patient who is seen today for followup. She has a history of mild hypothyroidism exogenous obesity treated hypertension. She does complain of increase in anxiety and stress. She is on Wellbutrin as well as an at bedtime dose of lorazepam. She has many stressors including a mother in poor health in a mother-in-law who lives at home. She is also concerned about weight gain. GI complaints include right upper quadrant pain of 8 months duration and alternating diarrhea and constipation. She has had a remote cholecystectomy    Review of Systems  Constitutional: Positive for unexpected weight change. Negative for fever, appetite change and fatigue.  HENT: Negative for hearing loss, ear pain, nosebleeds, congestion, sore throat, mouth sores, trouble swallowing, neck stiffness, dental problem, voice change, sinus pressure and tinnitus.   Eyes: Negative for photophobia, pain, redness and visual disturbance.  Respiratory: Negative for cough, chest tightness and shortness of breath.   Cardiovascular: Negative for chest pain, palpitations and leg swelling.  Gastrointestinal: Positive for abdominal pain, constipation and abdominal distention. Negative for nausea, vomiting, diarrhea, blood in stool and rectal pain.  Genitourinary: Negative for dysuria, urgency, frequency, hematuria, flank pain, vaginal bleeding, vaginal discharge, difficulty urinating, genital sores, vaginal pain, menstrual problem and pelvic pain.  Musculoskeletal: Negative for back pain and arthralgias.  Skin: Negative for rash.  Neurological: Negative for dizziness, syncope, speech difficulty, weakness, light-headedness, numbness and headaches.  Hematological: Negative for adenopathy. Does not bruise/bleed easily.  Psychiatric/Behavioral: Negative for suicidal ideas, behavioral problems, self-injury, dysphoric mood and agitation.  The patient is not nervous/anxious.        Objective:   Physical Exam  Constitutional: She is oriented to person, place, and time. She appears well-developed and well-nourished.  HENT:  Head: Normocephalic.  Right Ear: External ear normal.  Left Ear: External ear normal.  Mouth/Throat: Oropharynx is clear and moist.  Eyes: Conjunctivae and EOM are normal. Pupils are equal, round, and reactive to light.  Neck: Normal range of motion. Neck supple. No thyromegaly present.  Cardiovascular: Normal rate, regular rhythm, normal heart sounds and intact distal pulses.   Pulmonary/Chest: Effort normal and breath sounds normal.  Abdominal: Soft. Bowel sounds are normal. She exhibits no mass. There is no tenderness.  Musculoskeletal: Normal range of motion.  Lymphadenopathy:    She has no cervical adenopathy.  Neurological: She is alert and oriented to person, place, and time.  Skin: Skin is warm and dry. No rash noted.  Psychiatric: She has a normal mood and affect. Her behavior is normal.          Assessment & Plan:   Hypertension well controlled Abdominal pain with alternating constipation and diarrhea. Probable IBS. We'll set up colonoscopy for screening purposes Hypothyroidism Will followup a TSH weight gain

## 2012-02-01 ENCOUNTER — Telehealth: Payer: Self-pay | Admitting: *Deleted

## 2012-02-01 DIAGNOSIS — Z1211 Encounter for screening for malignant neoplasm of colon: Secondary | ICD-10-CM

## 2012-02-01 NOTE — Telephone Encounter (Signed)
Referral done

## 2012-02-01 NOTE — Telephone Encounter (Signed)
(  triage voicemail)  Pt states she was here in December and was supposed to schedule a colonoscopy at the beginning of the year.  Pt states she is now ready to go ahead and schedule.  (no order)

## 2012-02-04 ENCOUNTER — Encounter: Payer: Self-pay | Admitting: Gastroenterology

## 2012-02-04 ENCOUNTER — Telehealth: Payer: Self-pay | Admitting: *Deleted

## 2012-02-04 NOTE — Telephone Encounter (Signed)
Pt would like to have a colonoscopy scheduled, please.

## 2012-02-04 NOTE — Telephone Encounter (Signed)
Referral was put in 02/01/12 - please assist

## 2012-02-18 ENCOUNTER — Encounter: Payer: Self-pay | Admitting: *Deleted

## 2012-02-23 ENCOUNTER — Other Ambulatory Visit (INDEPENDENT_AMBULATORY_CARE_PROVIDER_SITE_OTHER): Payer: BC Managed Care – PPO

## 2012-02-23 ENCOUNTER — Ambulatory Visit (INDEPENDENT_AMBULATORY_CARE_PROVIDER_SITE_OTHER): Payer: BC Managed Care – PPO | Admitting: Gastroenterology

## 2012-02-23 ENCOUNTER — Encounter: Payer: Self-pay | Admitting: Gastroenterology

## 2012-02-23 VITALS — BP 92/62 | HR 80 | Ht 64.0 in | Wt 218.0 lb

## 2012-02-23 DIAGNOSIS — K7689 Other specified diseases of liver: Secondary | ICD-10-CM

## 2012-02-23 DIAGNOSIS — R1011 Right upper quadrant pain: Secondary | ICD-10-CM

## 2012-02-23 DIAGNOSIS — R197 Diarrhea, unspecified: Secondary | ICD-10-CM

## 2012-02-23 DIAGNOSIS — K219 Gastro-esophageal reflux disease without esophagitis: Secondary | ICD-10-CM

## 2012-02-23 LAB — CBC WITH DIFFERENTIAL/PLATELET
Basophils Relative: 0.4 % (ref 0.0–3.0)
Eosinophils Relative: 0.9 % (ref 0.0–5.0)
HCT: 42.9 % (ref 36.0–46.0)
Hemoglobin: 14.6 g/dL (ref 12.0–15.0)
Lymphs Abs: 1.6 10*3/uL (ref 0.7–4.0)
Monocytes Relative: 6.3 % (ref 3.0–12.0)
Neutro Abs: 5.6 10*3/uL (ref 1.4–7.7)
WBC: 7.8 10*3/uL (ref 4.5–10.5)

## 2012-02-23 LAB — BASIC METABOLIC PANEL
BUN: 14 mg/dL (ref 6–23)
CO2: 28 mEq/L (ref 19–32)
Calcium: 9.9 mg/dL (ref 8.4–10.5)
Creatinine, Ser: 0.7 mg/dL (ref 0.4–1.2)
Glucose, Bld: 98 mg/dL (ref 70–99)

## 2012-02-23 LAB — IBC PANEL
Iron: 87 ug/dL (ref 42–145)
Transferrin: 313.7 mg/dL (ref 212.0–360.0)

## 2012-02-23 LAB — HEPATIC FUNCTION PANEL: Albumin: 4.7 g/dL (ref 3.5–5.2)

## 2012-02-23 LAB — LIPASE: Lipase: 17 U/L (ref 11.0–59.0)

## 2012-02-23 LAB — SEDIMENTATION RATE: Sed Rate: 14 mm/hr (ref 0–22)

## 2012-02-23 MED ORDER — MOVIPREP 100 G PO SOLR: 1.0000 | Freq: Once | ORAL | Status: DC

## 2012-02-23 MED ORDER — CILIDINIUM-CHLORDIAZEPOXIDE 2.5-5 MG PO CAPS: 1.0000 | ORAL_CAPSULE | Freq: Three times a day (TID) | ORAL | Status: DC | PRN

## 2012-02-23 MED ORDER — COLESEVELAM HCL 625 MG PO TABS: ORAL_TABLET | ORAL | Status: DC

## 2012-02-23 MED ORDER — ALIGN PO CAPS: 1.0000 | ORAL_CAPSULE | Freq: Every day | ORAL | Status: DC

## 2012-02-23 NOTE — Patient Instructions (Signed)
Your procedure has been scheduled for 03/11/2012, please follow the seperate instructions.  Please go to the basement today for your labs.  Your abdominal ultrasound is scheduled for 02/26/2012 please arrive at [facility] 7:45am for a 8:00am appt, make sure you have nothing to eat or drink after midnight.  Take Welchol two tablets by mouth in the morning a rx has been sent to your pharmacy.  Take Librax one tablet by mouth three times a day a rx has been sent to your pharmacy. Take Align once a day samples given once the samples are gone you can buy this over the counter.  Follow the FODMAP diet given to you today.

## 2012-02-23 NOTE — Progress Notes (Signed)
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 This patient had cholecystectomy 3 years ago because of cholelithiasis. Her frequent stooling goes back many years, but is not associated with any rectal bleeding, melena, anorexia or weight loss or specific food intolerances. She gives a history of peptic ulcer disease at age 78, and has been on Prilosec for many years. Her diarrhea is associated with urgency and occasional incontinence. She denies lactose intolerance, or use of sorbitol or fructose. Stress seems to worsen her symptoms, and apparently she had been diagnosed with IBS, the past, anxiety and depression,and she currently is on Ativan and Wellbutrin. There's no history of any previous episodes of hepatitis or pancreatitis. The patient is very concerned about her family history of breast cancer in her mother, and her uncle who died with colon cancer. She has not had previous endoscopy or colonoscopy. She does suffer from fibromyalgia, chronic thyroid dysfunction, but has had a previous hysterectomy. Review of her radiographs shows multiple liver cyst on previous ultrasound in 2009. The pancreas was poorly visualized.  I have reviewed this patient's present history, medical and surgical past history, allergies and medications.     ROS: The remainder of the 10 point ROS is negative... she does complain of chronic fatigue, itching, chronic low back pain, chronic cough, frequent headaches, night sweats, frequent nosebleeds, shortness of breath exertion, chronic insomnia, peripheral edema, excessive thirst and occasional urinary incontinence 2. 4 peripheral edema she takes Lasix 40 mg a day she also takes when necessary Allegra. She denies excessive use of NSAIDs. She has been a fairly heavy user of alcohol and cigarettes for many  years with discontinuation in January.    Physical Exam: Blood pressure 92/62, pulse 60 and regular, weight 218 pounds, BMI 37.42. General well developed well nourished patient in no acute distress, appearing their stated age Eyes PERRLA, no icterus, fundoscopic exam per opthamologist Skin no lesions noted Neck supple, no adenopathy, no thyroid enlargement, no tenderness, no areas of chest wall tenderness noted. Chest clear to percussion and auscultation Heart no significant murmurs, gallops or rubs noted Abdomen no hepatosplenomegaly masses or tenderness, BS normal.  Rectal inspection normal no fissures, or fistulae noted.  No masses or tenderness on digital exam. Stool guaiac negative. Stool was formed and of normal color and consistency. Extremities no acute joint lesions, edema, phlebitis or evidence of cellulitis. Neurologic patient oriented x 3, cranial nerves intact, no focal neurologic deficits noted. Psychological mental status normal and normal affect.  Assessment and plan: Rule out chronic malabsorption syndrome versus IBS exacerbated by cholecystectomy and bile-salt enteropathy. We will need to exclude celiac disease, underlying inflammatory bowel disease or  chronic cold pancreatic insufficiency. Her right upper quadrant pain is definitely musculoskeletal in nature, but I've ordered a repeat ultrasound to be sure that she does not have enlarging liver cyst. Liver function tests were reviewed, and show a slight elevation in her transaminases, and I suspect she has mild fatty infiltration of her liver associated with her obesity.. On exam she appears to have mild COPD, and has recently stopped smoking. She gives a chronic history of peptic ulcer disease, and endoscopies been scheduled to exclude H. pylori infection, and we also will obtain small bowel biopsies. I placed the patient on a IBS-FODMAP diet, WelChol 2 tablets a day, Librax 3 times a day before meals,Align probiotics  daily, and   have ordered labs including celiac serology, amylase, lipase, and serum carotene. She obviously has chronic anxiety and depression, and is on multiple medications for this problem that she is to continue. If her workup is otherwise negative, we will check stools for O&P to exclude chronic giardiasis, also stool elastase-1 exam to assess pancreatic function.  Encounter Diagnoses  Name Primary?  . RUQ pain Yes  . Liver cyst   . Diarrhea   . Esophageal reflux

## 2012-02-25 LAB — CELIAC PANEL 10
Endomysial Screen: NEGATIVE
Gliadin IgA: 3.7 U/mL (ref <20)
Tissue Transglut Ab: 5.7 U/mL (ref <20)

## 2012-02-26 ENCOUNTER — Ambulatory Visit (HOSPITAL_COMMUNITY)
Admission: RE | Admit: 2012-02-26 | Discharge: 2012-02-26 | Disposition: A | Discharge status: Home / Self Care | Payer: BC Managed Care – PPO | Source: Ambulatory Visit | Attending: Gastroenterology | Admitting: Gastroenterology

## 2012-02-26 DIAGNOSIS — K219 Gastro-esophageal reflux disease without esophagitis: Secondary | ICD-10-CM

## 2012-02-26 DIAGNOSIS — R1011 Right upper quadrant pain: Secondary | ICD-10-CM | POA: Insufficient documentation

## 2012-02-26 DIAGNOSIS — Z9089 Acquired absence of other organs: Secondary | ICD-10-CM | POA: Insufficient documentation

## 2012-02-26 DIAGNOSIS — K7689 Other specified diseases of liver: Secondary | ICD-10-CM | POA: Insufficient documentation

## 2012-02-26 DIAGNOSIS — R197 Diarrhea, unspecified: Secondary | ICD-10-CM | POA: Insufficient documentation

## 2012-03-01 ENCOUNTER — Telehealth: Payer: Self-pay | Admitting: Gastroenterology

## 2012-03-01 MED ORDER — CHOLESTYRAMINE 4 G PO PACK: PACK | ORAL | Status: DC

## 2012-03-01 NOTE — Telephone Encounter (Signed)
Try Questran 4 g in juice in mid a.m. apart from other medications. This is a bile salt resin and is not absorbed and should not give her any side effects.

## 2012-03-01 NOTE — Telephone Encounter (Signed)
Pt seen on 02/23/12 and was placed on Welchol. Over the weekend, she had terrible leg pain and increased swelling in her legs. Pt reports this is the same type of reaction she had when she tried to take statins. She also reports the med didn't help with the diarrhea. Please advise. Thanks.

## 2012-03-01 NOTE — Telephone Encounter (Signed)
Informed pt Dr Jarold Motto ordered Questran 4g in juice mid am about 2 hours apart from other meds; it should not cause her any side effects. Pt stated understanding.

## 2012-03-11 ENCOUNTER — Encounter: Payer: Self-pay | Admitting: Gastroenterology

## 2012-03-11 ENCOUNTER — Ambulatory Visit (AMBULATORY_SURGERY_CENTER): Payer: BC Managed Care – PPO | Admitting: Gastroenterology

## 2012-03-11 VITALS — BP 119/70 | HR 73 | Temp 98.0°F | Resp 16 | Ht 64.0 in | Wt 218.0 lb

## 2012-03-11 DIAGNOSIS — K529 Noninfective gastroenteritis and colitis, unspecified: Secondary | ICD-10-CM

## 2012-03-11 DIAGNOSIS — D1330 Benign neoplasm of unspecified part of small intestine: Secondary | ICD-10-CM

## 2012-03-11 DIAGNOSIS — D126 Benign neoplasm of colon, unspecified: Secondary | ICD-10-CM

## 2012-03-11 DIAGNOSIS — R197 Diarrhea, unspecified: Secondary | ICD-10-CM

## 2012-03-11 DIAGNOSIS — R1011 Right upper quadrant pain: Secondary | ICD-10-CM

## 2012-03-11 MED ORDER — SODIUM CHLORIDE 0.9 % IV SOLN: 500.0000 mL | INTRAVENOUS | Status: DC

## 2012-03-11 NOTE — Op Note (Signed)
[facility] [address] [place], [place]  [address]  COLONOSCOPY PROCEDURE REPORT  PATIENT:  Marcia, Branch  MR#:  [id number] BIRTHDATE:  [birth year], 50 yrs. old  GENDER:  female ENDOSCOPIST:  Vania Rea. Jarold Motto, MD, [facility] REF. BY: PROCEDURE DATE:  03/11/2012 PROCEDURE:  Colonoscopy with biopsy ASA CLASS:  Class II INDICATIONS:  unexplained diarrhea, colorectal cancer screening MEDICATIONS:   propofol (Diprivan) 220 mg IV  DESCRIPTION OF PROCEDURE:   After the risks and benefits and of the procedure were explained, informed consent was obtained. Digital rectal exam was performed and revealed no abnormalities. The LB CF-H180AL [id number] endoscope was introduced through the anus and advanced to the cecum, which was identified by both the appendix and ileocecal valve.  The quality of the prep was excellent, using MoviPrep.  The instrument was then slowly withdrawn as the colon was fully examined. <<PROCEDUREIMAGES>>  FINDINGS:  No polyps or cancers were seen.  This was otherwise a normal examination of the colon.   Retroflexed views in the rectum revealed no abnormalities.    The scope was then withdrawn from the patient and the procedure completed.  COMPLICATIONS:  None ENDOSCOPIC IMPRESSION: 1) No polyps or cancers 2) Otherwise normal examination R/O MICROSCOPIC/COLLAGENOUS COLITIS RECOMMENDATIONS: 1) Await pathology results  REPEAT EXAM:  No  ______________________________ Vania Rea. Jarold Motto, MD, [facility]  CC:  n. eSIGNED:   Vania Rea. Mathan Darroch at 03/11/2012 03:30 PM  [name], [name], [phone]

## 2012-03-11 NOTE — Progress Notes (Signed)
No complaints noted in the recovery room. Maw  Patient did not experience any of the following events: a burn prior to discharge; a fall within the facility; wrong site/side/patient/procedure/implant event; or a hospital transfer or hospital admission upon discharge from the facility. (G8907) Patient did not have preoperative order for IV antibiotic SSI prophylaxis. (G8918)  

## 2012-03-11 NOTE — Patient Instructions (Signed)
A handout was given to your care partner for gastritis.  You May resume your prior medications today.  Please call if any questions or concerns.    YOU HAD AN ENDOSCOPIC PROCEDURE TODAY AT THE [facility]: Refer to the procedure report that was given to you for any specific questions about what was found during the examination.  If the procedure report does not answer your questions, please call your gastroenterologist to clarify.  If you requested that your care partner not be given the details of your procedure findings, then the procedure report has been included in a sealed envelope for you to review at your convenience later.  YOU SHOULD EXPECT: Some feelings of bloating in the abdomen. Passage of more gas than usual.  Walking can help get rid of the air that was put into your GI tract during the procedure and reduce the bloating. If you had a lower endoscopy (such as a colonoscopy or flexible sigmoidoscopy) you may notice spotting of blood in your stool or on the toilet paper. If you underwent a bowel prep for your procedure, then you may not have a normal bowel movement for a few days.  DIET: Your first meal following the procedure should be a light meal and then it is ok to progress to your normal diet.  A half-sandwich or bowl of soup is an example of a good first meal.  Heavy or fried foods are harder to digest and may make you feel nauseous or bloated.  Likewise meals heavy in dairy and vegetables can cause extra gas to form and this can also increase the bloating.  Drink plenty of fluids but you should avoid alcoholic beverages for 24 hours.  ACTIVITY: Your care partner should take you home directly after the procedure.  You should plan to take it easy, moving slowly for the rest of the day.  You can resume normal activity the day after the procedure however you should NOT DRIVE or use heavy machinery for 24 hours (because of the sedation medicines used during the test).     SYMPTOMS TO REPORT IMMEDIATELY: A gastroenterologist can be reached at any hour.  During normal business hours, 8:30 AM to 5:00 PM Monday through Friday, call [phone].  After hours and on weekends, please call the GI answering service at [phone] who will take a message and have the physician on call contact you.   Following lower endoscopy (colonoscopy or flexible sigmoidoscopy):  Excessive amounts of blood in the stool  Significant tenderness or worsening of abdominal pains  Swelling of the abdomen that is new, acute  Fever of 100F or higher  Following upper endoscopy (EGD)  Vomiting of blood or coffee ground material  New chest pain or pain under the shoulder blades  Painful or persistently difficult swallowing  New shortness of breath  Fever of 100F or higher  Black, tarry-looking stools  FOLLOW UP: If any biopsies were taken you will be contacted by phone or by letter within the next 1-3 weeks.  Call your gastroenterologist if you have not heard about the biopsies in 3 weeks.  Our staff will call the home number listed on your records the next business day following your procedure to check on you and address any questions or concerns that you may have at that time regarding the information given to you following your procedure. This is a courtesy call and so if there is no answer at the home number and we have not  heard from you through the emergency physician on call, we will assume that you have returned to your regular daily activities without incident.  SIGNATURES/CONFIDENTIALITY: You and/or your care partner have signed paperwork which will be entered into your electronic medical record.  These signatures attest to the fact that that the information above on your After Visit Summary has been reviewed and is understood.  Full responsibility of the confidentiality of this discharge information lies with you and/or your care-partner.

## 2012-03-11 NOTE — Progress Notes (Signed)
PT. Stated when they did her Gallbladder they chipped her front tooth.

## 2012-03-11 NOTE — Op Note (Signed)
[facility] [address] [place], [place]  [address]  ENDOSCOPY PROCEDURE REPORT  PATIENT:  Marcia, Branch  MR#:  [id number] BIRTHDATE:  [birth year], 50 yrs. old  GENDER:  female  ENDOSCOPIST:  Vania Rea. Jarold Motto, MD, [facility] Referred by:  PROCEDURE DATE:  03/11/2012 PROCEDURE:  EGD with biopsy, 43239, EGD with biopsy for H. pylori 62130 ASA CLASS:  Class II INDICATIONS:  diarrhea CHRONIC PPI USE.???  MEDICATIONS:   There was residual sedation effect present from prior procedure., propofol (Diprivan) 180 mg IV TOPICAL ANESTHETIC:  DESCRIPTION OF PROCEDURE:   After the risks and benefits of the procedure were explained, informed consent was obtained.  The LB GIF-H180 [id number] endoscope was introduced through the mouth and advanced to the second portion of the duodenum.  The instrument was slowly withdrawn as the mucosa was fully examined. <<PROCEDUREIMAGES>>  Mild gastritis was found. LINEAR ERYHEMA IN ANTRUM.CLO BX. DONE. Normal duodenal folds were noted. SI BX. DONE.  The esophagus and gastroesophageal junction were completely normal in appearance. Retroflexed views revealed no abnormalities.    The scope was then withdrawn from the patient and the procedure completed.  COMPLICATIONS:  None  ENDOSCOPIC IMPRESSION: 1) Mild gastritis RECOMMENDATIONS: 1) Await pathology results 2) Rx CLO if positive 3) continue PPI  ______________________________ Vania Rea. Jarold Motto, MD, [facility]  CC:  n. eSIGNED:   Vania Rea. Tniyah Nakagawa at 03/11/2012 03:47 PM  Wuebker, [facility], [phone]

## 2012-03-14 ENCOUNTER — Telehealth: Payer: Self-pay

## 2012-03-14 ENCOUNTER — Encounter: Payer: Self-pay | Admitting: Gastroenterology

## 2012-03-14 LAB — HELICOBACTER PYLORI SCREEN-BIOPSY: UREASE: NEGATIVE

## 2012-03-14 NOTE — Telephone Encounter (Signed)
  Follow up Call-  Call back number 03/11/2012  Post procedure Call Back phone  # [phone]  Permission to leave phone message Yes     Patient questions:  Do you have a fever, pain , or abdominal swelling? no Pain Score  0 *  Have you tolerated food without any problems? yes  Have you been able to return to your normal activities? yes  Do you have any questions about your discharge instructions: Diet   no Medications  no Follow up visit  no  Do you have questions or concerns about your Care? no  Actions: * If pain score is 4 or above: No action needed, pain <4.

## 2012-03-18 ENCOUNTER — Encounter: Payer: Self-pay | Admitting: Gastroenterology

## 2012-04-10 ENCOUNTER — Other Ambulatory Visit: Payer: Self-pay | Admitting: Internal Medicine

## 2012-05-09 ENCOUNTER — Ambulatory Visit: Payer: BC Managed Care – PPO | Admitting: Internal Medicine

## 2012-05-13 ENCOUNTER — Ambulatory Visit: Payer: BC Managed Care – PPO | Admitting: Internal Medicine

## 2012-09-10 ENCOUNTER — Other Ambulatory Visit: Payer: Self-pay | Admitting: Internal Medicine

## 2012-09-12 NOTE — Telephone Encounter (Signed)
Med phoned in °

## 2012-09-12 NOTE — Telephone Encounter (Signed)
LORAZEPAM refill request. Last filled on 6/16 qty 90 2 refills. Pt last seen on 10/16/11. Ok to refill?

## 2012-09-12 NOTE — Telephone Encounter (Signed)
ok 

## 2012-10-15 ENCOUNTER — Other Ambulatory Visit: Payer: Self-pay | Admitting: Internal Medicine

## 2012-10-28 ENCOUNTER — Other Ambulatory Visit: Payer: Self-pay | Admitting: Internal Medicine

## 2012-10-30 ENCOUNTER — Other Ambulatory Visit: Payer: Self-pay | Admitting: Internal Medicine

## 2012-11-13 ENCOUNTER — Other Ambulatory Visit: Payer: Self-pay | Admitting: Internal Medicine

## 2012-11-25 ENCOUNTER — Other Ambulatory Visit: Payer: Self-pay | Admitting: Internal Medicine

## 2012-11-25 DIAGNOSIS — Z1231 Encounter for screening mammogram for malignant neoplasm of breast: Secondary | ICD-10-CM

## 2012-12-04 ENCOUNTER — Other Ambulatory Visit: Payer: Self-pay | Admitting: Internal Medicine

## 2012-12-14 ENCOUNTER — Ambulatory Visit: Payer: BC Managed Care – PPO | Admitting: Internal Medicine

## 2012-12-19 ENCOUNTER — Encounter: Payer: Self-pay | Admitting: Internal Medicine

## 2012-12-19 ENCOUNTER — Ambulatory Visit (INDEPENDENT_AMBULATORY_CARE_PROVIDER_SITE_OTHER): Payer: BC Managed Care – PPO | Admitting: Internal Medicine

## 2012-12-19 VITALS — BP 140/90 | HR 87 | Temp 98.6°F | Resp 18 | Wt 227.0 lb

## 2012-12-19 MED ORDER — BUPROPION HCL ER (SR) 150 MG PO TB12: ORAL_TABLET | ORAL | Status: DC

## 2012-12-19 MED ORDER — TRIAMTERENE-HCTZ 75-50 MG PO TABS: ORAL_TABLET | ORAL | Status: DC

## 2012-12-19 MED ORDER — LISINOPRIL 20 MG PO TABS: ORAL_TABLET | ORAL | Status: DC

## 2012-12-19 MED ORDER — TOPIRAMATE 100 MG PO TABS: 100.0000 mg | ORAL_TABLET | Freq: Two times a day (BID) | ORAL | Status: DC

## 2012-12-19 MED ORDER — FUROSEMIDE 40 MG PO TABS: ORAL_TABLET | ORAL | Status: DC

## 2012-12-19 MED ORDER — LEVOTHYROXINE SODIUM 25 MCG PO TABS: ORAL_TABLET | ORAL | Status: DC

## 2012-12-19 MED ORDER — LORAZEPAM 0.5 MG PO TABS: ORAL_TABLET | ORAL | Status: DC

## 2012-12-19 MED ORDER — TOPIRAMATE 25 MG PO TABS: ORAL_TABLET | ORAL | Status: DC

## 2012-12-19 NOTE — Progress Notes (Signed)
Subjective:    Patient ID: Marcia Branch, female    DOB: [birth year], 52 y.o.   MRN: [id number]  HPI  Wt Readings from Last 3 Encounters:  12/19/12 227 lb (102.967 kg)  03/11/12 218 lb (98.884 kg)  02/23/12 218 lb (98.82 kg)   52 year old patient who is seen today for followup. She is seen today basically for medication refills. She has been under considerable situational stress. Her mother passed away approximately 13 months ago and her husband has been diagnosed with end-stage pulmonary fibrosis. She has a one-year history of intermittent chest pain that occurs paroxysmally and occasionally with exertion. She remains off tobacco products for greater than one year. She has hypothyroidism dyslipidemia and hypertension.  Past Medical History  Diagnosis Date  . ANXIETY   . HYPERLIPIDEMIA   . HYPERTENSION   . HYPOTHYROIDISM   . PEDAL EDEMA   . Peptic ulcer disease   . Fatty liver   . Benign liver cyst   . Fibromyalgia 1990    History   Social History  . Marital Status: Married    Spouse Name: N/A    Number of Children: 2  . Years of Education: N/A   Occupational History  . manager    Social History Main Topics  . Smoking status: Former Smoker -- 0.30 packs/day    Types: Cigarettes    Quit date: 10/27/2011  . Smokeless tobacco: Never Used  . Alcohol Use: Yes     Comment: occasionally  . Drug Use: No  . Sexually Active: Not on file   Other Topics Concern  . Not on file   Social History Narrative  . No narrative on file    Past Surgical History  Procedure Laterality Date  . Abdominal hysterectomy  2003  . Tubal ligation    . Cholecystectomy  2009    Family History  Problem Relation Age of Onset  . Breast cancer Mother   . Hypertension Mother   . Hyperlipidemia Mother   . Dementia Mother   . Hypothyroidism Mother   . Uterine cancer Mother     metastatic  . Colon polyps Mother   . Heart disease Mother   . Heart disease Father   . Colon cancer Maternal  Uncle   . Diabetes Maternal Aunt     maternal and perternal aunts    Allergies  Allergen Reactions  . Erythromycin     Current Outpatient Prescriptions on File Prior to Visit  Medication Sig Dispense Refill  . bifidobacterium infantis (ALIGN) capsule Take 1 capsule by mouth daily.  14 capsule  0  . fexofenadine (ALLEGRA) 180 MG tablet Take 1 tablet (180 mg total) by mouth daily.  90 tablet  6  . omeprazole (PRILOSEC) 20 MG capsule take 1 capsule by mouth once daily  30 capsule  1   No current facility-administered medications on file prior to visit.    BP 140/90  Pulse 87  Temp(Src) 98.6 F (37 C) (Oral)  Resp 18  Wt 227 lb (102.967 kg)  BMI 38.95 kg/m2  SpO2 98%       Review of Systems  Constitutional: Negative.   HENT: Negative for hearing loss, congestion, sore throat, rhinorrhea, dental problem, sinus pressure and tinnitus.   Eyes: Negative for pain, discharge and visual disturbance.  Respiratory: Negative for cough and shortness of breath.   Cardiovascular: Positive for chest pain. Negative for palpitations and leg swelling.  Gastrointestinal: Negative for nausea, vomiting, abdominal pain, diarrhea, constipation, blood in stool  and abdominal distention.  Genitourinary: Negative for dysuria, urgency, frequency, hematuria, flank pain, vaginal bleeding, vaginal discharge, difficulty urinating, vaginal pain and pelvic pain.  Musculoskeletal: Negative for joint swelling, arthralgias and gait problem.  Skin: Negative for rash.  Neurological: Negative for dizziness, syncope, speech difficulty, weakness, numbness and headaches.  Hematological: Negative for adenopathy.  Psychiatric/Behavioral: Negative for behavioral problems, dysphoric mood and agitation. The patient is nervous/anxious.        Objective:   Physical Exam  Constitutional: She is oriented to person, place, and time. She appears well-developed and well-nourished.  Weight 227 blood pressure 130/82  HENT:   Head: Normocephalic.  Right Ear: External ear normal.  Left Ear: External ear normal.  Mouth/Throat: Oropharynx is clear and moist.  Eyes: Conjunctivae and EOM are normal. Pupils are equal, round, and reactive to light.  Neck: Normal range of motion. Neck supple. No thyromegaly present.  Cardiovascular: Normal rate, regular rhythm, normal heart sounds and intact distal pulses.   Pulmonary/Chest: Effort normal and breath sounds normal.  Abdominal: Soft. Bowel sounds are normal. She exhibits no mass. There is no tenderness.  Musculoskeletal: Normal range of motion.  Lymphadenopathy:    She has no cervical adenopathy.  Neurological: She is alert and oriented to person, place, and time.  Skin: Skin is warm and dry. No rash noted.  Psychiatric: She has a normal mood and affect. Her behavior is normal.          Assessment & Plan:   Hypertension Hypothyroidism Situational anxiety Obesity/weight gain. The patient requests pharmacologic assistance. She has been using bupropion and will add the Topamax to her regimen. Will recheck in 2 or 3 months

## 2012-12-19 NOTE — Patient Instructions (Addendum)
Limit your sodium (Salt) intake    It is important that you exercise regularly, at least 20 minutes 3 to 4 times per week.  If you develop chest pain or shortness of breath seek  medical attention.  You need to lose weight.  Consider a lower calorie diet and regular exercise. 

## 2012-12-20 ENCOUNTER — Ambulatory Visit: Payer: BC Managed Care – PPO

## 2012-12-24 ENCOUNTER — Other Ambulatory Visit: Payer: Self-pay | Admitting: Internal Medicine

## 2013-01-16 ENCOUNTER — Emergency Department (HOSPITAL_COMMUNITY): Payer: BC Managed Care – PPO

## 2013-01-16 ENCOUNTER — Emergency Department (HOSPITAL_COMMUNITY)
Admission: EM | Admit: 2013-01-16 | Discharge: 2013-01-16 | Disposition: A | Discharge status: Home / Self Care | Payer: BC Managed Care – PPO | Attending: Emergency Medicine | Admitting: Emergency Medicine

## 2013-01-16 ENCOUNTER — Encounter (HOSPITAL_COMMUNITY): Payer: Self-pay | Admitting: *Deleted

## 2013-01-16 DIAGNOSIS — R079 Chest pain, unspecified: Secondary | ICD-10-CM

## 2013-01-16 DIAGNOSIS — Z87891 Personal history of nicotine dependence: Secondary | ICD-10-CM | POA: Insufficient documentation

## 2013-01-16 DIAGNOSIS — Z8739 Personal history of other diseases of the musculoskeletal system and connective tissue: Secondary | ICD-10-CM | POA: Insufficient documentation

## 2013-01-16 DIAGNOSIS — Z862 Personal history of diseases of the blood and blood-forming organs and certain disorders involving the immune mechanism: Secondary | ICD-10-CM | POA: Insufficient documentation

## 2013-01-16 DIAGNOSIS — F411 Generalized anxiety disorder: Secondary | ICD-10-CM | POA: Insufficient documentation

## 2013-01-16 DIAGNOSIS — I10 Essential (primary) hypertension: Secondary | ICD-10-CM | POA: Insufficient documentation

## 2013-01-16 DIAGNOSIS — Z79899 Other long term (current) drug therapy: Secondary | ICD-10-CM | POA: Insufficient documentation

## 2013-01-16 DIAGNOSIS — Z8719 Personal history of other diseases of the digestive system: Secondary | ICD-10-CM | POA: Insufficient documentation

## 2013-01-16 DIAGNOSIS — Z8639 Personal history of other endocrine, nutritional and metabolic disease: Secondary | ICD-10-CM | POA: Insufficient documentation

## 2013-01-16 LAB — BASIC METABOLIC PANEL
BUN: 24 mg/dL — ABNORMAL HIGH (ref 6–23)
CO2: 28 mEq/L (ref 19–32)
Chloride: 96 mEq/L (ref 96–112)
Creatinine, Ser: 0.91 mg/dL (ref 0.50–1.10)
Glucose, Bld: 120 mg/dL — ABNORMAL HIGH (ref 70–99)

## 2013-01-16 LAB — CBC
HCT: 40 % (ref 36.0–46.0)
Hemoglobin: 14.2 g/dL (ref 12.0–15.0)
MCV: 85.3 fL (ref 78.0–100.0)
Platelets: 288 10*3/uL (ref 150–400)
RBC: 4.69 MIL/uL (ref 3.87–5.11)
WBC: 6.4 10*3/uL (ref 4.0–10.5)

## 2013-01-16 LAB — POCT I-STAT TROPONIN I: Troponin i, poc: 0.02 ng/mL (ref 0.00–0.08)

## 2013-01-16 MED ORDER — SODIUM CHLORIDE 0.9 % IV SOLN
Freq: Once | INTRAVENOUS | Status: DC
Start: 2013-01-16 — End: 2013-01-16

## 2013-01-16 NOTE — ED Notes (Signed)
Patient is resting comfortably. 

## 2013-01-16 NOTE — ED Notes (Signed)
Pt is here with chest pain intermittent for one year.  Pt was sitting at computer this am and developed severe crushing chest pain that radiated to chin and hands went numb.  Pt is pain free now.  Father had hx of mi in 45s

## 2013-01-16 NOTE — ED Notes (Signed)
Family at bedside. 

## 2013-01-16 NOTE — ED Provider Notes (Signed)
History     CSN: [id number]  Arrival date & time 01/16/13  4098   First MD Initiated Contact with Patient 01/16/13 1001      Chief Complaint  Patient presents with  . Chest Pain    (Consider location/radiation/quality/duration/timing/severity/associated sxs/prior treatment) Patient is a 52 y.o. female presenting with chest pain. The history is provided by the patient. No language interpreter was used.  Chest Pain Pain location:  L chest Pain quality: stabbing   Pain radiates to:  Does not radiate Pain radiates to the back: no   Pain severity:  Severe Onset quality:  Gradual Timing:  Constant Progression:  Worsening Relieved by:  Nothing Worsened by:  Nothing tried Associated symptoms: anxiety   Associated symptoms: no abdominal pain   Risk factors: hypertension    Pt reports she had chest pain today. Pt reports she has had chest pain for the past year. Pt has seen Dr. Amador Cunas for the chest pain.  Pt reports she is currently pain free. Past Medical History  Diagnosis Date  . ANXIETY   . HYPERLIPIDEMIA   . HYPERTENSION   . HYPOTHYROIDISM   . PEDAL EDEMA   . Peptic ulcer disease   . Fatty liver   . Benign liver cyst   . Fibromyalgia 1990    Past Surgical History  Procedure Laterality Date  . Abdominal hysterectomy  2003  . Tubal ligation    . Cholecystectomy  2009    Family History  Problem Relation Age of Onset  . Breast cancer Mother   . Hypertension Mother   . Hyperlipidemia Mother   . Dementia Mother   . Hypothyroidism Mother   . Uterine cancer Mother     metastatic  . Colon polyps Mother   . Heart disease Mother   . Heart disease Father   . Colon cancer Maternal Uncle   . Diabetes Maternal Aunt     maternal and perternal aunts    History  Substance Use Topics  . Smoking status: Former Smoker -- 0.30 packs/day    Types: Cigarettes    Quit date: 10/27/2011  . Smokeless tobacco: Never Used  . Alcohol Use: Yes     Comment: occasionally     OB History   Grav Para Term Preterm Abortions TAB SAB Ect Mult Living                  Review of Systems  Cardiovascular: Positive for chest pain.  Gastrointestinal: Negative for abdominal pain.  All other systems reviewed and are negative.    Allergies  Erythromycin  Home Medications   Current Outpatient Rx  Name  Route  Sig  Dispense  Refill  . buPROPion (WELLBUTRIN SR) 150 MG 12 hr tablet      take 1 tablet by mouth twice a day   60 tablet   6   . furosemide (LASIX) 40 MG tablet      take 1 tablet by mouth once daily   90 tablet   4     NEEDS OFFICE VISIT   . levothyroxine (SYNTHROID, LEVOTHROID) 25 MCG tablet      take 1 tablet by mouth once daily   90 tablet   4   . lisinopril (PRINIVIL,ZESTRIL) 20 MG tablet      take 1 tablet by mouth once daily   90 tablet   4   . LORazepam (ATIVAN) 0.5 MG tablet   Oral   Take 0.5 mg by mouth at bedtime as  needed for anxiety.         Marland Kitchen omeprazole (PRILOSEC) 20 MG capsule   Oral   Take 20 mg by mouth daily as needed (for acid reflux).         . topiramate (TOPAMAX) 100 MG tablet   Oral   Take 1 tablet (100 mg total) by mouth 2 (two) times daily.   60 tablet   2   . triamterene-hydrochlorothiazide (MAXZIDE) 75-50 MG per tablet      take 1 tablet by mouth once daily   90 tablet   4     NEEDS OFFICE VISIT     BP 98/57  Pulse 82  Temp(Src) 98 F (36.7 C) (Oral)  Resp 18  Ht 5\' 4"  (1.626 m)  Wt 220 lb (99.791 kg)  BMI 37.74 kg/m2  SpO2 100%  Physical Exam  Constitutional: She is oriented to person, place, and time. She appears well-developed and well-nourished.  HENT:  Head: Normocephalic.  Eyes: Conjunctivae and EOM are normal. Pupils are equal, round, and reactive to light.  Neck: Normal range of motion.  Cardiovascular: Normal rate and regular rhythm.   Pulmonary/Chest: Effort normal and breath sounds normal.  Abdominal: Soft. She exhibits no distension.  Musculoskeletal: Normal  range of motion.  Neurological: She is alert and oriented to person, place, and time.  Skin: Skin is warm.  Psychiatric: She has a normal mood and affect.    ED Course  Procedures (including critical care time)  Labs Reviewed  CBC  BASIC METABOLIC PANEL  POCT I-STAT TROPONIN I   Dg Chest 2 View  01/16/2013  *RADIOLOGY REPORT*  Clinical Data: Chest pain for 3 days that radiates to the right side of the neck.  CHEST - 2 VIEW  Comparison: 02/04/2008  Findings: Two views of the chest demonstrate stable elevation of the right hemidiaphragm.  The lungs are clear without airspace disease or edema. Heart and mediastinum are within normal limits. The trachea is midline and no evidence for pleural effusions.  IMPRESSION: Stable chest radiograph findings.  No acute cardiopulmonary disease.   Original Report Authenticated By: Richarda Overlie, M.D.      1. Chest pain      Date: 01/16/2013  Rate: 74  Rhythm: normal sinus rhythm  QRS Axis: normal  Intervals: normal  ST/T Wave abnormalities: normal  Conduction Disutrbances:right bundle branch block  Narrative Interpretation:   Old EKG Reviewed: unchanged   MDM  Pt counseled on results.  Pt advised to follow up with her Md for recheck.           Lonia Skinner [place], PA-C 01/16/13 1546  Elson Areas, PA-C 01/16/13 1549  Lonia Skinner [place], PA-C 01/16/13 1549

## 2013-01-16 NOTE — ED Provider Notes (Signed)
  Medical screening examination/treatment/procedure(s) were performed by non-physician practitioner and as supervising physician I was immediately available for consultation/collaboration.    Gerhard Munch, MD 01/16/13 [phone]

## 2013-01-16 NOTE — ED Notes (Signed)
PA resident at bedside 

## 2013-01-17 ENCOUNTER — Other Ambulatory Visit: Payer: Self-pay | Admitting: *Deleted

## 2013-01-17 MED ORDER — TOPIRAMATE 100 MG PO TABS: 100.0000 mg | ORAL_TABLET | Freq: Two times a day (BID) | ORAL | Status: DC

## 2013-05-05 ENCOUNTER — Other Ambulatory Visit: Payer: Self-pay | Admitting: Internal Medicine

## 2013-05-05 ENCOUNTER — Encounter: Payer: Self-pay | Admitting: Family Medicine

## 2013-05-05 ENCOUNTER — Ambulatory Visit (INDEPENDENT_AMBULATORY_CARE_PROVIDER_SITE_OTHER): Payer: BC Managed Care – PPO | Admitting: Family Medicine

## 2013-05-05 VITALS — BP 126/74 | HR 92 | Temp 98.3°F

## 2013-05-05 DIAGNOSIS — R21 Rash and other nonspecific skin eruption: Secondary | ICD-10-CM

## 2013-05-05 MED ORDER — TRIAMCINOLONE ACETONIDE 0.1 % EX CREA: TOPICAL_CREAM | Freq: Two times a day (BID) | CUTANEOUS | Status: DC

## 2013-05-05 NOTE — Patient Instructions (Signed)
Avoid direct sun exposure for now

## 2013-05-05 NOTE — Progress Notes (Signed)
  Subjective:    Patient ID: Marcia Branch, female    DOB: 1960/12/24, 52 y.o.   MRN: [id number]  HPI Acute visit for bilateral upper extremity rash. Patient was concerned about possible shingles. Her husband takes chronic prednisone. Rash onset about 3 days ago. No recent change in medication. She has involvement mostly upper extremities outer arms and somewhat upper anterior chest. Diffuse erythema and dryness. Mild itching. Mild burning quality. Symptoms are relatively mild. Denies prior history of photosensitivity rash. No recent antibiotics. Takes several medications including lisinopril and Maxzide  Past Medical History  Diagnosis Date  . ANXIETY   . HYPERLIPIDEMIA   . HYPERTENSION   . HYPOTHYROIDISM   . PEDAL EDEMA   . Peptic ulcer disease   . Fatty liver   . Benign liver cyst   . Fibromyalgia 1990   Past Surgical History  Procedure Laterality Date  . Abdominal hysterectomy  2003  . Tubal ligation    . Cholecystectomy  2009    reports that she quit smoking about 18 months ago. Her smoking use included Cigarettes. She smoked 0.30 packs per day. She has never used smokeless tobacco. She reports that  drinks alcohol. She reports that she does not use illicit drugs. family history includes Breast cancer in her mother; Colon cancer in her maternal uncle; Colon polyps in her mother; Dementia in her mother; Diabetes in her maternal aunt; Heart disease in her father and mother; Hyperlipidemia in her mother; Hypertension in her mother; Hypothyroidism in her mother; and Uterine cancer in her mother. Allergies  Allergen Reactions  . Erythromycin Other (See Comments)    Reaction unknown      Review of Systems  Constitutional: Negative for fever and chills.  Respiratory: Negative for shortness of breath.   Skin: Positive for rash.       Objective:   Physical Exam  Constitutional: She appears well-developed and well-nourished.  Cardiovascular: Normal rate and regular rhythm.    Pulmonary/Chest: Effort normal and breath sounds normal. No respiratory distress. She has no wheezes. She has no rales.  Skin: Rash noted.  Patient's diffuse erythematous rash involving upper outer arms from shoulders to elbows as well as upper anterior chest and upper back. Rash is confined to sun exposed areas. Sparing of her arms. Slightly dry skin. No pustules. Nonscaly. No vesicles          Assessment & Plan:  Rash- appears most likely compatible with photosensitivity type rash. Only in sun exposed areas. Triamcinolone 0.1% cream twice a day. We explained this type rash can be related to photosensitivity from medication such as HCTZ or lisinopril. Touch base if not improving over the next couple weeks She will try to avoid sun in the meantime.

## 2013-05-09 ENCOUNTER — Other Ambulatory Visit: Payer: Self-pay | Admitting: Internal Medicine

## 2013-05-10 NOTE — Telephone Encounter (Signed)
Okay to fill Lorazepam?

## 2013-05-10 NOTE — Telephone Encounter (Signed)
ok 

## 2013-06-07 ENCOUNTER — Other Ambulatory Visit: Payer: Self-pay | Admitting: Internal Medicine

## 2013-06-22 ENCOUNTER — Other Ambulatory Visit: Payer: Self-pay | Admitting: *Deleted

## 2013-06-22 MED ORDER — LORAZEPAM 0.5 MG PO TABS: 0.5000 mg | ORAL_TABLET | Freq: Three times a day (TID) | ORAL | Status: DC | PRN

## 2013-07-10 ENCOUNTER — Other Ambulatory Visit: Payer: Self-pay | Admitting: Internal Medicine

## 2013-07-17 ENCOUNTER — Other Ambulatory Visit: Payer: Self-pay | Admitting: Internal Medicine

## 2013-07-19 ENCOUNTER — Inpatient Hospital Stay: Admission: RE | Admit: 2013-07-19 | Payer: BC Managed Care – PPO | Source: Ambulatory Visit

## 2013-07-20 ENCOUNTER — Ambulatory Visit
Admission: RE | Admit: 2013-07-20 | Discharge: 2013-07-20 | Disposition: A | Discharge status: Home / Self Care | Payer: BC Managed Care – PPO | Source: Ambulatory Visit | Attending: Internal Medicine | Admitting: Internal Medicine

## 2013-07-20 ENCOUNTER — Telehealth: Payer: Self-pay | Admitting: Internal Medicine

## 2013-07-20 DIAGNOSIS — Z1231 Encounter for screening mammogram for malignant neoplasm of breast: Secondary | ICD-10-CM

## 2013-07-20 NOTE — Telephone Encounter (Signed)
Pt has appt on 10/3, but has an issue w/ the inside of her vagina. Pt feels like its a little "cluster". She is bloated and feels pressure at night down there and has to urinate frequently. Pt would like to know if she could/should come in sooner.  Pt is very concerned about this. Has not had pap in 10 years. (partial hyster) pt does not have an obgyn.

## 2013-07-21 ENCOUNTER — Encounter: Payer: Self-pay | Admitting: Internal Medicine

## 2013-07-21 ENCOUNTER — Ambulatory Visit (INDEPENDENT_AMBULATORY_CARE_PROVIDER_SITE_OTHER): Payer: BC Managed Care – PPO | Admitting: Internal Medicine

## 2013-07-21 VITALS — BP 120/80 | HR 103 | Temp 98.3°F | Resp 20 | Wt 224.0 lb

## 2013-07-21 DIAGNOSIS — N760 Acute vaginitis: Secondary | ICD-10-CM

## 2013-07-21 MED ORDER — NYSTATIN-TRIAMCINOLONE 100000-0.1 UNIT/GM-% EX OINT: TOPICAL_OINTMENT | Freq: Two times a day (BID) | CUTANEOUS | Status: DC

## 2013-07-21 NOTE — Patient Instructions (Addendum)
Call or return to clinic prn if these symptoms worsen or fail to improve as anticipated.  HOME CARE INSTRUCTIONS  Take all medicines as directed by your caregiver.  Keep your genital area clean and dry. Avoid soap and only rinse the area with water.  Avoid douching. It can remove the healthy bacteria in the vagina.  Do not use tampons or have sexual intercourse until your vaginitis has been treated. Use sanitary pads while you have vaginitis.  Wipe from front to back. This avoids the spread of bacteria from the rectum to the vagina.  Let air reach your genital area.  Wear cotton underwear to decrease moisture buildup.  Avoid wearing underwear while you sleep until your vaginitis is gone.  Avoid tight pants and underwear or nylons without a cotton panel.  Take off wet clothing (especially bathing suits) as soon as possible.  Use mild, non-scented products. Avoid using irritants, such as:  Scented feminine sprays.  Fabric softeners.  Scented detergents.  Scented tampons.  Scented soaps or bubble baths.  Practice safe sex and use condoms. Condoms may prevent the spread of trichomoniasis and viral vaginitis.

## 2013-07-21 NOTE — Telephone Encounter (Signed)
Pt has appt today

## 2013-07-21 NOTE — Progress Notes (Signed)
Subjective:    Patient ID: Marcia Branch, female    DOB: [birth year], 52 y.o.   MRN: [id number]  HPI  52 year old patient who has noted some pain in discomfort involving the left vaginal vulvar area. No discharge. Symptoms have been intermittent for some weeks. No fever or constitutional complaints.  Past Medical History  Diagnosis Date  . ANXIETY   . HYPERLIPIDEMIA   . HYPERTENSION   . HYPOTHYROIDISM   . PEDAL EDEMA   . Peptic ulcer disease   . Fatty liver   . Benign liver cyst   . Fibromyalgia 1990    History   Social History  . Marital Status: Married    Spouse Name: N/A    Number of Children: 2  . Years of Education: N/A   Occupational History  . manager    Social History Main Topics  . Smoking status: Former Smoker -- 0.30 packs/day    Types: Cigarettes    Quit date: 10/27/2011  . Smokeless tobacco: Never Used  . Alcohol Use: Yes     Comment: occasionally  . Drug Use: No  . Sexual Activity: Not on file   Other Topics Concern  . Not on file   Social History Narrative  . No narrative on file    Past Surgical History  Procedure Laterality Date  . Abdominal hysterectomy  2003  . Tubal ligation    . Cholecystectomy  2009    Family History  Problem Relation Age of Onset  . Breast cancer Mother   . Hypertension Mother   . Hyperlipidemia Mother   . Dementia Mother   . Hypothyroidism Mother   . Uterine cancer Mother     metastatic  . Colon polyps Mother   . Heart disease Mother   . Heart disease Father   . Colon cancer Maternal Uncle   . Diabetes Maternal Aunt     maternal and perternal aunts    Allergies  Allergen Reactions  . Erythromycin Other (See Comments)    Reaction unknown    Current Outpatient Prescriptions on File Prior to Visit  Medication Sig Dispense Refill  . buPROPion (WELLBUTRIN SR) 150 MG 12 hr tablet take 1 tablet by mouth twice a day  60 tablet  6  . furosemide (LASIX) 40 MG tablet take 1 tablet by mouth once daily  90 tablet   4  . levothyroxine (SYNTHROID, LEVOTHROID) 25 MCG tablet take 1 tablet by mouth once daily  90 tablet  4  . lisinopril (PRINIVIL,ZESTRIL) 20 MG tablet take 1 tablet by mouth once daily  90 tablet  4  . LORazepam (ATIVAN) 0.5 MG tablet Take 1 tablet (0.5 mg total) by mouth every 8 (eight) hours as needed for anxiety.  90 tablet  2  . omeprazole (PRILOSEC) 20 MG capsule TAKE ONE CAPSULE BY MOUTH DAILY  30 capsule  3  . topiramate (TOPAMAX) 100 MG tablet TAKE 1 TABLET (100 MG TOTAL) BY MOUTH 2 (TWO) TIMES DAILY.  60 tablet  2  . triamcinolone cream (KENALOG) 0.1 % Apply topically 2 (two) times daily.  85.2 g  1  . triamterene-hydrochlorothiazide (MAXZIDE) 75-50 MG per tablet take 1 tablet by mouth once daily  90 tablet  4   No current facility-administered medications on file prior to visit.    BP 120/80  Pulse 103  Temp(Src) 98.3 F (36.8 C) (Oral)  Resp 20  Wt 224 lb (101.606 kg)  BMI 38.43 kg/m2  SpO2 98%  Review of Systems  Constitutional: Negative.   HENT: Negative for hearing loss, congestion, sore throat, rhinorrhea, dental problem, sinus pressure and tinnitus.   Eyes: Negative for pain, discharge and visual disturbance.  Respiratory: Negative for cough and shortness of breath.   Cardiovascular: Negative for chest pain, palpitations and leg swelling.  Gastrointestinal: Negative for nausea, vomiting, abdominal pain, diarrhea, constipation, blood in stool and abdominal distention.  Genitourinary: Positive for vaginal pain. Negative for dysuria, urgency, frequency, hematuria, flank pain, vaginal bleeding, vaginal discharge, difficulty urinating and pelvic pain.  Musculoskeletal: Negative for joint swelling, arthralgias and gait problem.  Skin: Negative for rash.  Neurological: Negative for dizziness, syncope, speech difficulty, weakness, numbness and headaches.  Hematological: Negative for adenopathy.  Psychiatric/Behavioral: Negative for behavioral problems, dysphoric  mood and agitation. The patient is not nervous/anxious.        Objective:   Physical Exam  Constitutional: She appears well-developed and well-nourished. No distress.  Blood pressure 110/70  Genitourinary:  Erythema without discharge noted involving the vaginal and especially a left vulvar area Status post hysterectomy          Assessment & Plan:  Vulvitis. We'll treat with triamcinolone nystatin cream Hypertension. Blood pressure well-controlled Dyslipidemia  CPX as scheduled

## 2013-07-28 ENCOUNTER — Encounter: Payer: Self-pay | Admitting: Internal Medicine

## 2013-07-28 ENCOUNTER — Ambulatory Visit (INDEPENDENT_AMBULATORY_CARE_PROVIDER_SITE_OTHER): Payer: BC Managed Care – PPO | Admitting: Internal Medicine

## 2013-07-28 VITALS — BP 100/60 | HR 68 | Temp 98.2°F | Resp 20 | Ht 63.25 in | Wt 223.0 lb

## 2013-07-28 DIAGNOSIS — R609 Edema, unspecified: Secondary | ICD-10-CM

## 2013-07-28 DIAGNOSIS — E785 Hyperlipidemia, unspecified: Secondary | ICD-10-CM

## 2013-07-28 DIAGNOSIS — I10 Essential (primary) hypertension: Secondary | ICD-10-CM

## 2013-07-28 DIAGNOSIS — E039 Hypothyroidism, unspecified: Secondary | ICD-10-CM

## 2013-07-28 DIAGNOSIS — Z0000 Encounter for general adult medical examination without abnormal findings: Secondary | ICD-10-CM

## 2013-07-28 LAB — COMPREHENSIVE METABOLIC PANEL
ALT: 46 U/L — ABNORMAL HIGH (ref 0–35)
CO2: 25 mEq/L (ref 19–32)
Creatinine, Ser: 1.1 mg/dL (ref 0.4–1.2)
GFR: 54.25 mL/min — ABNORMAL LOW (ref >60.00)
Total Bilirubin: 0.7 mg/dL (ref 0.3–1.2)

## 2013-07-28 LAB — LIPID PANEL
Cholesterol: 264 mg/dL — ABNORMAL HIGH (ref 0–200)
HDL: 60.6 mg/dL (ref >39.00)
Total CHOL/HDL Ratio: 4
Triglycerides: 138 mg/dL (ref 0.0–149.0)

## 2013-07-28 LAB — CBC WITH DIFFERENTIAL/PLATELET
Eosinophils Relative: 1.3 % (ref 0.0–5.0)
HCT: 41.8 % (ref 36.0–46.0)
Hemoglobin: 14.1 g/dL (ref 12.0–15.0)
Lymphs Abs: 1.9 10*3/uL (ref 0.7–4.0)
Monocytes Relative: 6.3 % (ref 3.0–12.0)
Neutro Abs: 5 10*3/uL (ref 1.4–7.7)
RDW: 13.2 % (ref 11.5–14.6)
WBC: 7.5 10*3/uL (ref 4.5–10.5)

## 2013-07-28 NOTE — Progress Notes (Signed)
Patient ID: Marcia Branch, female   DOB: Dec 28, 1960, 52 y.o.   MRN: [id number]

## 2013-07-28 NOTE — Patient Instructions (Addendum)
Discontinue diuretic therapy  Limit your sodium (Salt) intake    It is important that you exercise regularly, at least 20 minutes 3 to 4 times per week.  If you develop chest pain or shortness of breath seek  medical attention.  Please check your blood pressure on a regular basis.  If it is consistently greater than 150/90, please make an office appointment.  Gynecology followup as planned  Return in 6 months for follow-up

## 2013-07-28 NOTE — Progress Notes (Signed)
Subjective:    Patient ID: Marcia Branch, female    DOB: 1961/01/28, 52 y.o.   MRN: [id number]  HPI  History of Present Illness:   52 year-old patient seen today for an annual exam. She has a 9 year history of treated hypertension and a history of mild dyslipidemia, and statin intolerance. She has been intolerant to both Lipitor and Crestor. She has hypothyroidism. She has had some pedal edema over the years and does take when necessary furosemide. This also involves her hands. She describes puffiness toward the end of the day and has been reasonably well-controlled on Lasix therapy. She was evaluated in the ED in the spring after a syncope it was felt to be orthostatic.  She continues to be concerned about possibility of vaginal pathology.  She describes a lesion in the left vaginal wall that is intermittent over the years. She has had a remote hysterectomy due to cervical polyps and heavy bleeding.  Current Allergies:  ! ERYTHROMYCIN (ERYTHROMYCIN)  Statin intolerance  Past Medical History:    Hyperlipidemia  Hypertension  lower extremity edema  Hypothyroidism  remote peptic ulcer disease   Past Surgical History:    status post cholecystectomy 2009  hysterectomy. 2000  tubal ligation 1989  Colonoscopy 2013  Family History:  father age 95, status post CABG at age 67  mother. History breast cancer, hypertension, dyslipidemia, senile dementia, hypothyroidism  Two brothers living and well  -two sistershistory of hypothyroidism  Maternal grandmother also with breast cancer   Social History:  Married  Current Smoker  Husband is presently disabled due to pulmonary fibrosis. Hopeful for a lung transplant  Risk Factors:  Tobacco use: Former   Review of Systems  Constitutional: Positive for fatigue. Negative for fever, appetite change and unexpected weight change.  HENT: Negative for hearing loss, ear pain, nosebleeds, congestion, sore throat, mouth sores, trouble swallowing,  neck stiffness, dental problem, voice change, sinus pressure and tinnitus.   Eyes: Negative for photophobia, pain, redness and visual disturbance.  Respiratory: Negative for cough, chest tightness and shortness of breath.   Cardiovascular: Negative for chest pain, palpitations and leg swelling.  Gastrointestinal: Negative for nausea, vomiting, abdominal pain, diarrhea, constipation, blood in stool, abdominal distention and rectal pain.  Genitourinary: Positive for vaginal pain. Negative for dysuria, urgency, frequency, hematuria, flank pain, vaginal bleeding, vaginal discharge, difficulty urinating, genital sores, menstrual problem and pelvic pain.  Musculoskeletal: Negative for back pain and arthralgias.  Skin: Negative for rash.  Neurological: Negative for dizziness, syncope, speech difficulty, weakness, light-headedness, numbness and headaches.  Hematological: Negative for adenopathy. Does not bruise/bleed easily.  Psychiatric/Behavioral: Positive for decreased concentration. Negative for suicidal ideas, behavioral problems, self-injury, dysphoric mood and agitation. The patient is nervous/anxious.        Objective:   Physical Exam  Constitutional: She is oriented to person, place, and time. She appears well-developed and well-nourished.  HENT:  Head: Normocephalic and atraumatic.  Right Ear: External ear normal.  Left Ear: External ear normal.  Mouth/Throat: Oropharynx is clear and moist.  Eyes: Conjunctivae and EOM are normal.  Neck: Normal range of motion. Neck supple. No JVD present. No thyromegaly present.  Cardiovascular: Normal rate, regular rhythm, normal heart sounds and intact distal pulses.   No murmur heard. Pulmonary/Chest: Effort normal and breath sounds normal. She has no wheezes. She has no rales.  Abdominal: Soft. Bowel sounds are normal. She exhibits no distension and no mass. There is no tenderness. There is no rebound and no guarding.  Musculoskeletal: Normal range  of motion. She exhibits no edema and no tenderness.  Neurological: She is alert and oriented to person, place, and time. She has normal reflexes. No cranial nerve deficit. She exhibits normal muscle tone. Coordination normal.  Skin: Skin is warm and dry. No rash noted.  Psychiatric: She has a normal mood and affect. Her behavior is normal.          Assessment & Plan:   Hypertension. Blood pressure is in a low-normal range. She has a history of syncope. We'll discontinue diuretic therapy History of intermittent vaginal discomfort. We'll set her for GYN evaluation Hypothyroidism. We'll check a TSH Anxiety disorder History of intermittent pedal edema

## 2013-08-08 ENCOUNTER — Other Ambulatory Visit: Payer: Self-pay | Admitting: Internal Medicine

## 2013-08-18 ENCOUNTER — Ambulatory Visit (INDEPENDENT_AMBULATORY_CARE_PROVIDER_SITE_OTHER): Payer: BC Managed Care – PPO | Admitting: Gynecology

## 2013-08-18 ENCOUNTER — Encounter: Payer: Self-pay | Admitting: Gynecology

## 2013-08-18 VITALS — BP 124/64 | HR 84 | Resp 16 | Ht 63.0 in | Wt 221.0 lb

## 2013-08-18 DIAGNOSIS — Z0000 Encounter for general adult medical examination without abnormal findings: Secondary | ICD-10-CM

## 2013-08-18 DIAGNOSIS — Z01419 Encounter for gynecological examination (general) (routine) without abnormal findings: Secondary | ICD-10-CM

## 2013-08-18 DIAGNOSIS — F3289 Other specified depressive episodes: Secondary | ICD-10-CM

## 2013-08-18 DIAGNOSIS — Z803 Family history of malignant neoplasm of breast: Secondary | ICD-10-CM

## 2013-08-18 DIAGNOSIS — Z8049 Family history of malignant neoplasm of other genital organs: Secondary | ICD-10-CM

## 2013-08-18 DIAGNOSIS — N951 Menopausal and female climacteric states: Secondary | ICD-10-CM

## 2013-08-18 DIAGNOSIS — Z800 Family history of malignant neoplasm of digestive organs: Secondary | ICD-10-CM

## 2013-08-18 DIAGNOSIS — F329 Major depressive disorder, single episode, unspecified: Secondary | ICD-10-CM

## 2013-08-18 LAB — POCT URINALYSIS DIPSTICK

## 2013-08-18 NOTE — Progress Notes (Signed)
52 y.o. Married Caucasian female   [id number] here for annual exam. Pt reports menses are absent. She does report hot flashes, does have night sweats, does not have vaginal dryness.  She is not using lubricants.  She does not report post-menopasual bleeding.  Hysterectomy for menorrhagia at 52.  Patient's mother had breast cancer at 23, then developed uterine cancer 67-treated with RT, died 2y later.  Pt reports history of genital wart? On inner labia for 5y-no itching or burning, husband just diagnosed terminal pulmonary fibrosis-68m ago, on lung transplant list. A lot of stress with this and mother's recent death.  No LMP recorded. Patient has had a hysterectomy.          Sexually active: no  The current method of family planning is status post hysterectomy and post menopausal status.    Exercising: yes  The patient does not participate in regular exercise at present. Last pap: 10 years ago Negative  Abnormal PAP: no Mammogram: 06/2013 Normal BSE: yes Colonoscopy:  04/2012 Normal DEXA:  none Alcohol:  2 drinks/month occ  Tobacco: no  Hgb: PCP ; Urine: Negative  Health Maintenance  Topic Date Due  . Pap Smear  05/09/1979  . Tetanus/tdap  05/08/1980  . Influenza Vaccine  05/26/2013  . Mammogram  07/21/2015  . Colonoscopy  03/11/2022    Family History  Problem Relation Age of Onset  . Breast cancer Mother   . Hypertension Mother   . Hyperlipidemia Mother   . Dementia Mother   . Hypothyroidism Mother   . Uterine cancer Mother     metastatic  . Colon polyps Mother   . Heart disease Mother   . Heart disease Father   . Coronary artery disease Father   . Colon cancer Maternal Uncle   . Diabetes Maternal Aunt     maternal and perternal aunts  . Breast cancer Maternal Grandmother     Patient Active Problem List   Diagnosis Date Noted  . Depression 08/18/2013  . ANXIETY 01/16/2010  . HYPOTHYROIDISM 04/30/2008  . HYPERLIPIDEMIA 04/30/2008  . HYPERTENSION 04/30/2008  . PEDAL  EDEMA 04/30/2008    Past Medical History  Diagnosis Date  . ANXIETY   . HYPERLIPIDEMIA   . HYPERTENSION   . HYPOTHYROIDISM   . PEDAL EDEMA   . Peptic ulcer disease   . Fatty liver   . Benign liver cyst   . Fibromyalgia 1990  . Depression     Past Surgical History  Procedure Laterality Date  . Tubal ligation    . Cholecystectomy  2009  . Vaginal hysterectomy  2003    [facility]    Allergies: Erythromycin  Current Outpatient Prescriptions  Medication Sig Dispense Refill  . buPROPion (WELLBUTRIN SR) 150 MG 12 hr tablet TAKE ONE TABLET BY MOUTH TWICE DAILY  60 tablet  5  . levothyroxine (SYNTHROID, LEVOTHROID) 25 MCG tablet take 1 tablet by mouth once daily  90 tablet  4  . lisinopril (PRINIVIL,ZESTRIL) 20 MG tablet take 1 tablet by mouth once daily  90 tablet  4  . LORazepam (ATIVAN) 0.5 MG tablet Take 1 tablet (0.5 mg total) by mouth every 8 (eight) hours as needed for anxiety.  90 tablet  2  . omeprazole (PRILOSEC) 20 MG capsule TAKE ONE CAPSULE BY MOUTH DAILY  30 capsule  3  . topiramate (TOPAMAX) 100 MG tablet TAKE 1 TABLET (100 MG TOTAL) BY MOUTH 2 (TWO) TIMES DAILY.  60 tablet  2  . nystatin-triamcinolone ointment (MYCOLOG) Apply  topically 2 (two) times daily.  30 g  2  . triamcinolone cream (KENALOG) 0.1 % Apply topically 2 (two) times daily.  85.2 g  1   No current facility-administered medications for this visit.    ROS: Pertinent items are noted in HPI.  Exam:    BP 124/64  Pulse 84  Resp 16  Ht 5\' 3"  (1.6 m)  Wt 221 lb (100.245 kg)  BMI 39.16 kg/m2 Weight change: @WEIGHTCHANGE @ Last 3 height recordings:  Ht Readings from Last 3 Encounters:  08/18/13 5\' 3"  (1.6 m)  07/28/13 5' 3.25" (1.607 m)  01/16/13 5\' 4"  (1.626 m)   General appearance: alert, cooperative and appears stated age Head: Normocephalic, without obvious abnormality, atraumatic Neck: no adenopathy, no carotid bruit, no JVD, supple, symmetrical, trachea midline and thyroid not enlarged, symmetric,  no tenderness/mass/nodules Lungs: clear to auscultation bilaterally Breasts: normal appearance, no masses or tenderness Heart: regular rate and rhythm, S1, S2 normal, no murmur, click, rub or gallop Abdomen: soft, non-tender; bowel sounds normal; no masses,  no organomegaly Extremities: extremities normal, atraumatic, no cyanosis or edema Skin: Skin color, texture, turgor normal. No rashes or lesions Lymph nodes: Cervical, supraclavicular, and axillary nodes normal. no inguinal nodes palpated Neurologic: Grossly normal   Pelvic: External genitalia:  no lesions, 2mm tag noted on left majora, skin intact              Urethra: normal appearing urethra with no masses, tenderness or lesions              Bartholins and Skenes: normal                 Vagina: normal appearing vagina with normal color and discharge, no lesions              Cervix: absent              Pap taken: no        Bimanual Exam:  Uterus:  absent                                      Adnexa:    no masses                                      Rectovaginal: Confirms                                      Anus:  normal sphincter tone, no lesions  A: well woman Family history of breast, uterine and colon cancer     P: mammogram annually, discussed possible hereditary link to cancers on maternal side-2 colon, 2 breast and 1 uterine and one unknown in cousin-sister's had had hysterectomies for benign indications, only pt has ovaries in place.  Offered BRCA testing, Lynch also drawn-discussed increased surveillance-breast MRI, pelvic u/s, can consider prophylaxis as well, Information provided, accepts will discuss further after results.  It is also possible that cancers are environmental and affects on future generations are less clear.   pap smear not done, guidelines reviewed PUS orderedPt also given support regarding spouse's recent diagnosis, PCP asked that we manage these symptoms as well as recent perimenopausal symptoms.  We  discussed change to effexor from wellbutrin to  treat both, may need higher dose, will need to taper and will discuss further at u/s visit And she is agreeable. Vulvar lesion possible small wart, pt assured does not require treatment and is not associated with cancer,  Pt ok to leave  counseled on breast self exam, mammography screening, adequate intake of calcium and vitamin D, diet and exercise return annually or prn Discussed PAP guideline changes, importance of weight bearing exercises, calcium, vit D and balanced diet.  An After Visit Summary was printed and given to the patient.  Additional time spent discussing hereditary cancers and screenings 64m, >50% face to face

## 2013-08-23 ENCOUNTER — Telehealth: Payer: Self-pay | Admitting: *Deleted

## 2013-08-23 NOTE — Telephone Encounter (Signed)
Call to patient to discuss genetic testing options.  LMTCB.

## 2013-08-24 ENCOUNTER — Telehealth: Payer: Self-pay | Admitting: Gynecology

## 2013-08-24 NOTE — Telephone Encounter (Signed)
Patient returned call, advised Dr Farrel Gobble has looked into additional genetic testing that patient may qualify for.  "My Risk" available through genetic counselor may be a more complete and more cost effective option but must speak with genetic counselor.  Patient agreeable.  Will have Annia Friendly call patient. Patient will notify me of decision.  Routed to provider, patient agreeable to disposition. Encounter closed.

## 2013-08-24 NOTE — Telephone Encounter (Signed)
LMTCB to discuss ins benefits and to schedule PUS. Advised there are openings on Tuesday 11/4.

## 2013-08-29 ENCOUNTER — Encounter: Payer: Self-pay | Admitting: Gynecology

## 2013-08-29 ENCOUNTER — Ambulatory Visit (INDEPENDENT_AMBULATORY_CARE_PROVIDER_SITE_OTHER): Payer: BC Managed Care – PPO | Admitting: Gynecology

## 2013-08-29 ENCOUNTER — Ambulatory Visit (INDEPENDENT_AMBULATORY_CARE_PROVIDER_SITE_OTHER): Payer: BC Managed Care – PPO

## 2013-08-29 VITALS — BP 130/78 | Resp 16 | Ht 63.0 in | Wt 223.0 lb

## 2013-08-29 DIAGNOSIS — F329 Major depressive disorder, single episode, unspecified: Secondary | ICD-10-CM

## 2013-08-29 DIAGNOSIS — Z8049 Family history of malignant neoplasm of other genital organs: Secondary | ICD-10-CM

## 2013-08-29 DIAGNOSIS — N951 Menopausal and female climacteric states: Secondary | ICD-10-CM

## 2013-08-29 DIAGNOSIS — Z800 Family history of malignant neoplasm of digestive organs: Secondary | ICD-10-CM

## 2013-08-29 DIAGNOSIS — Z803 Family history of malignant neoplasm of breast: Secondary | ICD-10-CM

## 2013-08-29 MED ORDER — VENLAFAXINE HCL ER 37.5 MG PO CP24: 37.5000 mg | ORAL_CAPSULE | Freq: Every day | ORAL | Status: DC

## 2013-08-29 NOTE — Progress Notes (Signed)
     Pt here for evaluation of ovaries due to strong family history of uterine, breast and colon cancers.  Pt had genetic testing done, results pending.  Pt is interested in starting medication but not estrogen until genetic testing is back for menopausal symptoms.  Pt is under a lot of stress with husbands illness and feels that the menopause is additional.  She was also wondering about some lab work done last month at PCP. We reviewed the u/s images with her, the ovaries appear normal size, no follicles noted, no free fluid. Uterus and cervix surgically absent. We suggest trying effexor for hot flashes, she is instructed to slowly increase after 2w to 75mg , she may be able with time to decrease her wellbutrin, we have asked that she contact us in 2w re dose change, and we ask for medication consult in 76m. We changed from BrCA testing to my25 which will be more inclusive, she spoke to genetic counselor and we will plan on meeting after those results are in to determine if she needs additional surveillance or prophylactic surgery. Questions addressed.

## 2013-08-29 NOTE — Patient Instructions (Signed)
Can increase effexor after 2w, please call after 4w for refill and follow up regarding dose

## 2013-09-01 ENCOUNTER — Telehealth: Payer: Self-pay | Admitting: *Deleted

## 2013-09-01 NOTE — Telephone Encounter (Signed)
Pt calling for lab results done 10/3. Please advise

## 2013-09-05 NOTE — Telephone Encounter (Signed)
Please call/notify patient that lab/test/procedure is normal except cholesterol is increased from 213 to 264

## 2013-09-06 NOTE — Telephone Encounter (Signed)
Spoke to pt told her labs normal except Cholesterol went to 213 to 264. Pt verbalized understanding and stated Dr. Kirtland Bouchard called her last week with results and told her he has samples for her to try for Cholesterol. Told pt okay, samples are at the front desk. Pt said will come by tomorrow to pickup. Told her that is fine.

## 2013-09-06 NOTE — Telephone Encounter (Signed)
Left message on voicemail to call office.  

## 2013-09-25 ENCOUNTER — Telehealth: Payer: Self-pay | Admitting: Gynecology

## 2013-09-25 ENCOUNTER — Other Ambulatory Visit: Payer: Self-pay | Admitting: Gynecology

## 2013-09-25 MED ORDER — VENLAFAXINE HCL ER 75 MG PO CP24: 75.0000 mg | ORAL_CAPSULE | Freq: Every day | ORAL | Status: DC

## 2013-09-25 NOTE — Telephone Encounter (Signed)
pls check with pt regarding dose, the 37.5 or did she increase to 75?  Then will refill

## 2013-09-25 NOTE — Telephone Encounter (Signed)
Spoke with patient. She has been taking 75 mg of Effexor and was feeling okay up until a few days ago. She is now noticing more trouble sleeping and return of anxiety. She would like to remain on effexor and requests refills, but I wanted to make sure you didn't want to change anything before I sent refills?

## 2013-09-25 NOTE — Telephone Encounter (Signed)
Patient was told to call before getting refills of effexor. Karin Golden @ [address]

## 2013-09-25 NOTE — Telephone Encounter (Signed)
Spoke with Dr. Farrel Gobble okay to order one month of 75 mg Effexor. Patient advised of tablet dosage change to only take on pill per day. She will call after one month to let us know how she is doing.

## 2013-09-25 NOTE — Telephone Encounter (Signed)
Taken care of by Aura Camps see phone note 12/1

## 2013-09-25 NOTE — Telephone Encounter (Signed)
Patient taking 1 1/2 tablets daily? Does she need to increase to 2 tablets daily?

## 2013-09-28 ENCOUNTER — Telehealth: Payer: Self-pay | Admitting: *Deleted

## 2013-09-28 NOTE — Telephone Encounter (Signed)
Genetic test results received today and sent to your office for review.

## 2013-09-29 NOTE — Telephone Encounter (Signed)
Patient notified of negative BRCA and Lynch as directed by Dr Farrel Gobble and recommend continue plan of increased surveillance due to extensive history.  Will mail patient the patient copy of report provided by [facility].

## 2013-09-29 NOTE — Telephone Encounter (Signed)
Inform pt the genetic tests are negative for both BRCA and Lynch (colon) suggest we continue increased surveillance as discussed based on history alone

## 2013-10-06 ENCOUNTER — Telehealth: Payer: Self-pay | Admitting: *Deleted

## 2013-10-06 NOTE — Telephone Encounter (Signed)
Annia Friendly, genetic counselor, called on 10-04-13, to see if we had received results from [facility] and if we had talked to patient.  Advised her that yes, results were received and reported to patient as negative as Dr Farrel Gobble instructed.  Did not discuss any part of "clinically insignificant mutation" but advised patient that Harriett Sine would likely call to review results.  Harriett Sine states she will call patient, review results with her and send her a summary results since no "patient copy" is provided.  Routing to provider for final review. Will close encounter

## 2013-10-08 ENCOUNTER — Other Ambulatory Visit: Payer: Self-pay | Admitting: Internal Medicine

## 2013-10-23 ENCOUNTER — Telehealth: Payer: Self-pay | Admitting: Gynecology

## 2013-10-23 MED ORDER — VENLAFAXINE HCL ER 75 MG PO CP24: 75.0000 mg | ORAL_CAPSULE | Freq: Every day | ORAL | Status: DC

## 2013-10-23 NOTE — Telephone Encounter (Signed)
Patient on Effexor 75 mg and was told to give update in one month.  States medication is helping.  On a scale of 1-10 she is about a 6.  Anxiety continues to be an issue, husband is terminally ill.  Patient is notified that Dr Farrel Gobble is out of office this week and will route to Dr Hyacinth Meeker.  Patient has one pill left and requests one month refill and wait till Dr Farrel Gobble returns.   Ok for refill?

## 2013-10-23 NOTE — Telephone Encounter (Signed)
Rx to pharmacy.  Does note need to be sent to Dr. Farrel Gobble or is pt going to call back?

## 2013-10-23 NOTE — Telephone Encounter (Signed)
Patient was told to call and give info after taking the medication Dr. Farrel Gobble prescribed.

## 2013-10-23 NOTE — Telephone Encounter (Signed)
I routed it to both of you so it should be there for her. I will hold it in my box until i hear back from her.

## 2013-11-10 ENCOUNTER — Telehealth: Payer: Self-pay | Admitting: Emergency Medicine

## 2013-11-10 NOTE — Telephone Encounter (Addendum)
Dr. Charlies Constable,  For Breast MRI, requiring peer to peer review for evaluation and approval.   Can you call [phone], select provider prompt #1 and choose imaging.   Patient's insurance plan: "Personal Choice" or "independant blue cross" ID [id number]  Can you let me know the results?  approved, info given to T Feliz Lincoln 11/15/13

## 2013-11-14 ENCOUNTER — Telehealth: Payer: Self-pay | Admitting: Gynecology

## 2013-11-15 NOTE — Telephone Encounter (Signed)
Dr Charlies Constable, we refilled her one month while you were out at end of Dec, she wanted to wait to talk to you before changes. Please advise.

## 2013-11-15 NOTE — Telephone Encounter (Signed)
LM regarding her medications, pt has appt 2/4, i have asked her to call back if it needs addressing before then

## 2013-11-16 NOTE — Telephone Encounter (Signed)
I left a message for pt will try her again tomorrow

## 2013-11-20 MED ORDER — VENLAFAXINE HCL ER 75 MG PO CP24: 75.0000 mg | ORAL_CAPSULE | Freq: Every day | ORAL | Status: DC

## 2013-11-20 NOTE — Telephone Encounter (Signed)
Last refilled: 10/23/13 #30/1 refill Last AEX: 08/18/13  3 month Recheck scheduled for 11/29/13  Please advise

## 2013-11-20 NOTE — Telephone Encounter (Signed)
Needing refill for the effexor sent to [facility] at [phone].

## 2013-11-20 NOTE — Telephone Encounter (Signed)
i see refill from 1/26 done

## 2013-11-20 NOTE — Telephone Encounter (Signed)
Order for Effexor sent to [facility].  Routing to Dr. Charlies Constable for peer to peer for MRI.

## 2013-11-21 NOTE — Telephone Encounter (Signed)
Patient notified and aware that rx has been sent. 

## 2013-11-28 ENCOUNTER — Other Ambulatory Visit: Payer: Self-pay | Admitting: Internal Medicine

## 2013-11-29 ENCOUNTER — Ambulatory Visit: Payer: BC Managed Care – PPO | Admitting: Gynecology

## 2013-12-25 ENCOUNTER — Encounter: Payer: Self-pay | Admitting: Gynecology

## 2013-12-25 ENCOUNTER — Telehealth: Payer: Self-pay | Admitting: Gynecology

## 2013-12-25 ENCOUNTER — Ambulatory Visit (INDEPENDENT_AMBULATORY_CARE_PROVIDER_SITE_OTHER): Payer: BC Managed Care – PPO | Admitting: Gynecology

## 2013-12-25 VITALS — BP 122/78 | HR 72 | Resp 16 | Ht 63.0 in | Wt 204.0 lb

## 2013-12-25 DIAGNOSIS — Z79899 Other long term (current) drug therapy: Secondary | ICD-10-CM

## 2013-12-25 DIAGNOSIS — F329 Major depressive disorder, single episode, unspecified: Secondary | ICD-10-CM

## 2013-12-25 DIAGNOSIS — F3289 Other specified depressive episodes: Secondary | ICD-10-CM

## 2013-12-25 DIAGNOSIS — Z800 Family history of malignant neoplasm of digestive organs: Secondary | ICD-10-CM

## 2013-12-25 DIAGNOSIS — N951 Menopausal and female climacteric states: Secondary | ICD-10-CM

## 2013-12-25 DIAGNOSIS — Z803 Family history of malignant neoplasm of breast: Secondary | ICD-10-CM

## 2013-12-25 DIAGNOSIS — Z8049 Family history of malignant neoplasm of other genital organs: Secondary | ICD-10-CM

## 2013-12-25 DIAGNOSIS — F32A Depression, unspecified: Secondary | ICD-10-CM

## 2013-12-25 MED ORDER — VENLAFAXINE HCL ER 150 MG PO CP24: 150.0000 mg | ORAL_CAPSULE | Freq: Every day | ORAL | Status: DC

## 2013-12-25 NOTE — Progress Notes (Signed)
Pt is here for medication follow up.  She states that her menopausal symptoms of hot flashes have resolved since starting but that some other issues with her husband are making her anxious and tearful again but not to the extent that she was before starting the effexor.  She states that her husband is still waiting for his lung transplant but that her has some issues with a coronary blockage that needs to be addressed.  In addition, he has become mean and at times violent.  He has had outbursts against his family and she is upset regarding his change in behavior, he has been on the same antidepressants for years.Pt states that she has started to take some time for herself, she has started going to the gym, she has lost 19# since our last visit.  She stopped her ativan that she was using nightly and can sleep most nights.   She is feeling better overall but has these exacerbated situations to deal with and she would like to try the higher dose of effexor.    I have recommended that her current dose of 75 is not therapeutic for depression and we will increase to 150mg , she should call after being on that dose for 4-6w to see if she is feeling better.  And she is agreeable, encouraged with her weight loss and time for herself. Pt was approved for breast MRI, will schedule PUS for ovaries normal, will continue to follow  58m spent discussing treatment for situational depression and management of menopausal symptoms, >50% face to face

## 2013-12-26 NOTE — Telephone Encounter (Signed)
Left detailed message to advise:   Breast MRI scheduled for [facility], 01/03/14 at 2:45, at [address]. Can call [phone] if needs to reschedule. Approval until 01/12/14.

## 2013-12-27 NOTE — Telephone Encounter (Signed)
Calling patient to ensure that she got prior message and has no questions about procedure.   Message left to return call to [place] at [phone].

## 2014-01-01 ENCOUNTER — Other Ambulatory Visit: Payer: Self-pay | Admitting: Internal Medicine

## 2014-01-02 ENCOUNTER — Other Ambulatory Visit: Payer: Self-pay | Admitting: Internal Medicine

## 2014-01-03 ENCOUNTER — Inpatient Hospital Stay: Admission: RE | Admit: 2014-01-03 | Payer: BC Managed Care – PPO | Source: Ambulatory Visit

## 2014-01-16 NOTE — Telephone Encounter (Signed)
Patient cancelled MRI appointment scheduled for 01/03/14.

## 2014-02-06 ENCOUNTER — Other Ambulatory Visit: Payer: Self-pay | Admitting: Internal Medicine

## 2014-02-19 NOTE — Telephone Encounter (Signed)
Dr. Charlies Constable, patient has cancelled Breast MRI appointment that was scheduled for 01/03/14 due to personal cost. Is it okay to close encounter?

## 2014-02-19 NOTE — Telephone Encounter (Signed)
yes

## 2014-02-21 ENCOUNTER — Other Ambulatory Visit: Payer: Self-pay | Admitting: Gynecology

## 2014-02-21 DIAGNOSIS — F329 Major depressive disorder, single episode, unspecified: Secondary | ICD-10-CM

## 2014-02-21 DIAGNOSIS — N951 Menopausal and female climacteric states: Secondary | ICD-10-CM

## 2014-02-21 DIAGNOSIS — F32A Depression, unspecified: Secondary | ICD-10-CM

## 2014-02-21 NOTE — Telephone Encounter (Signed)
[facility] on [address] [phone]) calling to get a refill on Effexor Rx 150mg  #30.

## 2014-02-21 NOTE — Telephone Encounter (Signed)
Refill request for [facility] 150 Last filled by MD on - 12/25/13, #30 X 1 Last AEX - 08/18/13  Pt was last seen on 12/25/13 and dose was increased.  Pt was to follow up with phone call in 4-6 weeks with update.   Message left for pt to return call.  Advised to ask for Olivia Mackie if I am unavailable.

## 2014-02-22 NOTE — Telephone Encounter (Signed)
RC from patient.  She states she is doing well on 150 mg daily.  She is feeling good and does not have the waining effect she felt on the lower dose.  Pt would like to continue on this dose. Please advise refills.  Pt is aware Dr. Charlies Constable out of the office today.

## 2014-02-23 ENCOUNTER — Other Ambulatory Visit: Payer: Self-pay

## 2014-02-23 MED ORDER — VENLAFAXINE HCL ER 150 MG PO CP24: 150.0000 mg | ORAL_CAPSULE | Freq: Every day | ORAL | Status: AC

## 2014-02-23 NOTE — Telephone Encounter (Signed)
rx was already sent to Dr Charlies Constable from [place] in her inbasket. This encounter does not need response. Encounter closed

## 2014-04-05 ENCOUNTER — Other Ambulatory Visit: Payer: Self-pay | Admitting: Internal Medicine

## 2014-04-18 ENCOUNTER — Other Ambulatory Visit: Payer: Self-pay | Admitting: Internal Medicine

## 2014-05-11 ENCOUNTER — Encounter: Payer: Self-pay | Admitting: Obstetrics & Gynecology

## 2014-07-03 ENCOUNTER — Other Ambulatory Visit: Payer: Self-pay | Admitting: *Deleted

## 2014-07-03 MED ORDER — OMEPRAZOLE 20 MG PO CPDR: DELAYED_RELEASE_CAPSULE | ORAL | Status: DC

## 2014-07-13 ENCOUNTER — Other Ambulatory Visit: Payer: Self-pay | Admitting: Internal Medicine

## 2014-08-27 ENCOUNTER — Encounter: Payer: Self-pay | Admitting: Gynecology

## 2014-09-18 ENCOUNTER — Other Ambulatory Visit: Payer: Self-pay | Admitting: Internal Medicine

## 2014-10-29 ENCOUNTER — Other Ambulatory Visit: Payer: Self-pay | Admitting: Internal Medicine

## 2014-12-07 ENCOUNTER — Telehealth: Payer: Self-pay | Admitting: Internal Medicine

## 2014-12-07 MED ORDER — LISINOPRIL 20 MG PO TABS: 20.0000 mg | ORAL_TABLET | Freq: Every day | ORAL | Status: DC

## 2014-12-07 MED ORDER — OMEPRAZOLE 20 MG PO CPDR: 20.0000 mg | DELAYED_RELEASE_CAPSULE | Freq: Every day | ORAL | Status: AC

## 2014-12-07 MED ORDER — LEVOTHYROXINE SODIUM 25 MCG PO TABS: 25.0000 ug | ORAL_TABLET | Freq: Every day | ORAL | Status: DC

## 2014-12-07 NOTE — Telephone Encounter (Signed)
Left message on voicemail to call office. Rx's sent to pharmacy.

## 2014-12-07 NOTE — Telephone Encounter (Signed)
Patient would like a re-fill on the following:  omeprazole (PRILOSEC) 20 MG capsule, lisinopril (PRINIVIL,ZESTRIL) 20 MG tablet and levothyroxine (SYNTHROID, LEVOTHROID) 25 MCG tablet.  She is completely out  her CPX is 12/28/14. She would like them sent to [place] [place].

## 2014-12-28 ENCOUNTER — Encounter: Payer: Self-pay | Admitting: Internal Medicine

## 2015-03-01 ENCOUNTER — Telehealth: Payer: Self-pay | Admitting: Internal Medicine

## 2015-03-01 MED ORDER — LEVOTHYROXINE SODIUM 25 MCG PO TABS: 25.0000 ug | ORAL_TABLET | Freq: Every day | ORAL | Status: DC

## 2015-03-01 MED ORDER — LISINOPRIL 20 MG PO TABS: 20.0000 mg | ORAL_TABLET | Freq: Every day | ORAL | Status: AC

## 2015-03-01 NOTE — Telephone Encounter (Signed)
Pt has been travelling and is currently between insurances due to a job change. Has been out of her Synthroid and Lisinopril for a few weeks. Is starting to feel very bad and would like a one month supply of each called into [facility] in [place]. Will call to schedule an appt on her return.

## 2015-03-01 NOTE — Telephone Encounter (Signed)
Pt notified Rx refills sent to pharmacy as requested. Pt verbalized understanding.

## 2015-04-05 ENCOUNTER — Other Ambulatory Visit: Payer: Self-pay | Admitting: Internal Medicine

## 2015-04-09 ENCOUNTER — Other Ambulatory Visit: Payer: Self-pay | Admitting: Internal Medicine

## 2022-04-08 ENCOUNTER — Encounter: Payer: Self-pay | Admitting: Internal Medicine
